# Patient Record
Sex: Female | Born: 1980 | Race: White | Hispanic: No | Marital: Married | Smoking: Never smoker
Health system: Southern US, Community
[De-identification: ages and names within clinical notes are randomized; demographics above are authoritative.]

## PROBLEM LIST (undated history)

## (undated) ENCOUNTER — Encounter (HOSPITAL_COMMUNITY): Payer: Self-pay

## (undated) ENCOUNTER — Ambulatory Visit (HOSPITAL_COMMUNITY): Payer: Self-pay | Admitting: Otology & Neurotology

## (undated) DIAGNOSIS — G9601 Cranial cerebrospinal fluid leak, spontaneous: Secondary | ICD-10-CM

## (undated) DIAGNOSIS — G9600 Cerebrospinal fluid leak, unspecified: Secondary | ICD-10-CM

## (undated) DIAGNOSIS — Z789 Other specified health status: Secondary | ICD-10-CM

## (undated) DIAGNOSIS — T4145XA Adverse effect of unspecified anesthetic, initial encounter: Secondary | ICD-10-CM

## (undated) DIAGNOSIS — T8859XA Other complications of anesthesia, initial encounter: Secondary | ICD-10-CM

## (undated) DIAGNOSIS — D333 Benign neoplasm of cranial nerves: Secondary | ICD-10-CM

## (undated) DIAGNOSIS — E039 Hypothyroidism, unspecified: Secondary | ICD-10-CM

## (undated) HISTORY — DX: Cranial cerebrospinal fluid leak, spontaneous: G96.01

## (undated) HISTORY — DX: Cerebrospinal fluid leak: G96.0

## (undated) HISTORY — PX: LAPAROSCOPY: SHX197

## (undated) HISTORY — PX: CRANIOTOMY: SHX93

## (undated) HISTORY — PX: BRAIN SURGERY: SHX531

## (undated) SURGERY — TYMPANOPLASTY WITH MASTOIDECTOMY
Anesthesia: General | Site: Ear | Laterality: Left

## (undated) MED ORDER — LIDOCAINE HCL (PF) 1 % IJ SOLN
0.1000 mL | INTRAMUSCULAR | Status: AC | PRN
Start: 2018-02-28 — End: ?

## (undated) MED ORDER — ACETAMINOPHEN 325 MG PO TABS
975.00 mg | ORAL_TABLET | Freq: Once | ORAL | Status: AC
Start: 2018-02-28 — End: 2018-02-28

## (undated) MED ORDER — LACTATED RINGERS IV SOLN
INTRAVENOUS | Status: AC
Start: 2018-02-28 — End: ?

---

## 2007-10-20 HISTORY — PX: OOPHORECTOMY: SHX86

## 2010-08-19 HISTORY — PX: LAPAROSCOPY: SHX197

## 2011-05-09 ENCOUNTER — Encounter (HOSPITAL_COMMUNITY): Payer: Self-pay | Admitting: *Deleted

## 2011-05-09 ENCOUNTER — Inpatient Hospital Stay (HOSPITAL_COMMUNITY): Payer: BC Managed Care – PPO

## 2011-05-09 ENCOUNTER — Inpatient Hospital Stay (HOSPITAL_COMMUNITY)
Admission: AD | Admit: 2011-05-09 | Discharge: 2011-05-10 | DRG: 380 | Disposition: A | Discharge status: Home / Self Care | Payer: BC Managed Care – PPO | Source: Ambulatory Visit | Attending: Obstetrics & Gynecology | Admitting: Obstetrics & Gynecology

## 2011-05-09 DIAGNOSIS — O021 Missed abortion: Principal | ICD-10-CM | POA: Diagnosis present

## 2011-05-09 DIAGNOSIS — Z98891 History of uterine scar from previous surgery: Secondary | ICD-10-CM

## 2011-05-09 DIAGNOSIS — O364XX0 Maternal care for intrauterine death, not applicable or unspecified: Secondary | ICD-10-CM

## 2011-05-09 DIAGNOSIS — O039 Complete or unspecified spontaneous abortion without complication: Secondary | ICD-10-CM | POA: Diagnosis present

## 2011-05-09 HISTORY — DX: Other complications of anesthesia, initial encounter: T88.59XA

## 2011-05-09 HISTORY — DX: Other specified health status: Z78.9

## 2011-05-09 HISTORY — DX: Adverse effect of unspecified anesthetic, initial encounter: T41.45XA

## 2011-05-09 NOTE — Progress Notes (Signed)
yesterday hit stomach on door knob right lower abdomen no bruising noted.  Then today went to the bathroom and noticed a 3 in circle of dark brown blood in under wear.  No other bleeding noted since then

## 2011-05-10 ENCOUNTER — Other Ambulatory Visit: Payer: Self-pay | Admitting: Obstetrics & Gynecology

## 2011-05-10 ENCOUNTER — Encounter (HOSPITAL_COMMUNITY): Payer: Self-pay | Admitting: *Deleted

## 2011-05-10 DIAGNOSIS — O039 Complete or unspecified spontaneous abortion without complication: Secondary | ICD-10-CM | POA: Diagnosis present

## 2011-05-10 DIAGNOSIS — O021 Missed abortion: Secondary | ICD-10-CM

## 2011-05-10 LAB — CBC
HCT: 35 % — ABNORMAL LOW (ref 36.0–46.0)
Hemoglobin: 11.9 g/dL — ABNORMAL LOW (ref 12.0–15.0)
MCH: 28.7 pg (ref 26.0–34.0)
MCHC: 34 g/dL (ref 30.0–36.0)
MCV: 84.5 fL (ref 78.0–100.0)

## 2011-05-10 LAB — TYPE AND SCREEN
ABO/RH(D): A POS
Antibody Screen: NEGATIVE

## 2011-05-10 LAB — MRSA PCR SCREENING: MRSA by PCR: INVALID — AB

## 2011-05-10 MED ORDER — LACTATED RINGERS IV SOLN
INTRAVENOUS | Status: DC | PRN
  Administered 2011-05-10 (×2): via INTRAVENOUS

## 2011-05-10 MED ORDER — SODIUM CHLORIDE 0.9 % IJ SOLN
3.0000 mL | Freq: Two times a day (BID) | INTRAMUSCULAR | Status: DC
  Administered 2011-05-10: 3 mL via INTRAVENOUS

## 2011-05-10 MED ORDER — ZOLPIDEM TARTRATE 10 MG PO TABS
10.0000 mg | ORAL_TABLET | Freq: Every evening | ORAL | Status: DC | PRN
  Administered 2011-05-10: 10 mg via ORAL
  Filled 2011-05-10: qty 1

## 2011-05-10 MED ORDER — HYDROXYZINE HCL 50 MG/ML IM SOLN
50.0000 mg | Freq: Four times a day (QID) | INTRAMUSCULAR | Status: DC | PRN
  Filled 2011-05-10: qty 1

## 2011-05-10 MED ORDER — ZOLPIDEM TARTRATE ER 6.25 MG PO TBCR: 6.2500 mg | EXTENDED_RELEASE_TABLET | Freq: Every evening | ORAL | Status: AC | PRN

## 2011-05-10 MED ORDER — OXYTOCIN 20 UNITS IN LACTATED RINGERS INFUSION - SIMPLE
250.0000 mL/h | INTRAVENOUS | Status: DC
  Filled 2011-05-10: qty 1000

## 2011-05-10 MED ORDER — MISOPROSTOL 200 MCG PO TABS
600.0000 ug | ORAL_TABLET | Freq: Four times a day (QID) | ORAL | Status: DC
  Filled 2011-05-10: qty 3

## 2011-05-10 MED ORDER — SODIUM CHLORIDE 0.9 % IV SOLN: 250.0000 mL | INTRAVENOUS | Status: DC

## 2011-05-10 MED ORDER — MISOPROSTOL 200 MCG PO TABS
600.0000 ug | ORAL_TABLET | Freq: Four times a day (QID) | ORAL | Status: DC
  Administered 2011-05-10: 600 ug via ORAL
  Filled 2011-05-10 (×2): qty 3

## 2011-05-10 MED ORDER — NALBUPHINE SYRINGE 5 MG/0.5 ML
5.0000 mg | INJECTION | INTRAMUSCULAR | Status: DC | PRN
  Administered 2011-05-10: 5 mg via INTRAVENOUS
  Filled 2011-05-10: qty 1

## 2011-05-10 MED ORDER — NALBUPHINE HCL 10 MG/ML IJ SOLN
5.0000 mg | INTRAMUSCULAR | Status: DC | PRN
  Administered 2011-05-10 (×2): 5 mg via INTRAVENOUS
  Filled 2011-05-10: qty 1

## 2011-05-10 MED ORDER — MISOPROSTOL 200 MCG PO TABS
600.0000 ug | ORAL_TABLET | Freq: Four times a day (QID) | ORAL | Status: DC
  Administered 2011-05-10: 600 ug via VAGINAL
  Filled 2011-05-10 (×3): qty 3

## 2011-05-10 MED ORDER — SODIUM CHLORIDE 0.9 % IJ SOLN: 3.0000 mL | INTRAMUSCULAR | Status: DC | PRN

## 2011-05-10 NOTE — Progress Notes (Signed)
  Patient progressed to expel the fetus in caul, intact BOW, of autolysed fetus 28 gm, then shortly thereafter expelled the placenta, apparently intact. Fetus inspected and shown to parents, no anatomic abnormalities noted.    Patient will be discharged later this pm.

## 2011-05-10 NOTE — Progress Notes (Signed)
Patient uncomfortable with contractions q 1-2 mins. Vag Exam, Bulging BOW noted in vag vault, no fetal parts thru cervix.  No urge to push/no rectal pressure.  Will all labor to continue at current process.  Delivery expected soon.

## 2011-05-10 NOTE — H&P (Signed)
Katelyn Willis is a 30 y.o. female at 16.6 weeks presenting for vaginal bleeding. She has been receiving PNC at [facility] in [place], but noticed brown bleeding while out to dinner in [place]. Maternal Medical History:  Reason for admission: Reason for admission: vaginal bleeding.  Contractions: Onset was 13-24 hours ago.   Frequency: irregular.   Perceived severity is mild.    Prenatal complications: Bleeding.     OB History    Grav Para Term Preterm Abortions TAB SAB Ect Mult Living   3 2 2  0 0 0 0 0 0 2     Past Medical History  Diagnosis Date  . No pertinent past medical history   . Complication of anesthesia     unpleasant waking up, thrashing and crying   Past Surgical History  Procedure Date  . Cesarean section 2007, 2005    x 2 attempt  . Laparoscopy Nov 2011    x2   Family History: family history is not on file. Social History:  reports that she has never smoked. She has never used smokeless tobacco. She reports that she does not drink alcohol or use illicit drugs.  Review of Systems  Genitourinary:       Multiple episode of spotting throughout pregnancy. New episode of light brown bleeding today.   All other systems reviewed and are negative.      Blood pressure 102/60, pulse 83, temperature 98.4 F (36.9 C), temperature source Oral, resp. rate 18, height 5\' 6"  (1.676 m), weight 67.767 kg (149 lb 6.4 oz). Maternal Exam:  Abdomen: Surgical scars: low transverse.   Fundal height is S=D.   Fetal presentation: vertex  Introitus: Normal vulva. Vaginal discharge: brown, creamy, discharge w/ normal odor.  Ferning test: negative.   Pelvis: adequate for delivery.   Cervix: Cervix evaluated by sterile speculum exam and digital exam.     Fetal Exam Fetal Monitor Review: Mode: ultrasound.   Baseline rate: No cardiac activity per [place].      Physical Exam  Constitutional: She appears well-developed and well-nourished.  HENT:  Head: Normocephalic.    Eyes: Pupils are equal, round, and reactive to light.  Cardiovascular: Normal rate and regular rhythm.   Murmur (systolic II/VI) heard. Respiratory: Effort normal and breath sounds normal.  GI: Soft. Bowel sounds are normal. There is no tenderness. There is no guarding.  Genitourinary: Uterus is not tender. Cervix exhibits no motion tenderness and no friability. Right adnexum displays no tenderness. Left adnexum displays no tenderness. There is bleeding (brownish-red, creamy, discharge w/ normal odor) around the vagina. Vaginal discharge: brown, creamy, discharge w/ normal odor.  Cervix long and closed   Prenatal labs: ABO, Rh:  A + (per pt) Antibody:   Rubella:   RPR:    HBsAg:    HIV:    GBS:     Results for orders placed during the hospital encounter of 05/09/11 (from the past 24 hour(s))  CBC     Status: Abnormal   Collection Time   05/10/11 12:35 AM      Component Value Range   WBC 6.8  4.0 - 10.5 (K/uL)   RBC 4.14  3.87 - 5.11 (MIL/uL)   Hemoglobin 11.9 (*) 12.0 - 15.0 (g/dL)   HCT 16.1 (*) 09.6 - 46.0 (%)   MCV 84.5  78.0 - 100.0 (fL)   MCH 28.7  26.0 - 34.0 (pg)   MCHC 34.0  30.0 - 36.0 (g/dL)   RDW 04.5  40.9 - 81.1 (%)  Platelets 203  150 - 400 (K/uL)  TYPE AND SCREEN     Status: Normal (Preliminary result)   Collection Time   05/10/11 12:35 AM      Component Value Range   ABO/RH(D) A POS     Antibody Screen PENDING     Sample Expiration 05/13/2011     ZO:XWRU, no cardiac activity, vertex, normal fluid, measuring 14.5 weeks, cervix closed  Assessment/Plan: Assessment: 1. Second trimester fetal demise 2. Hx LTCS x 2   3. Discussed expectant management, medical management and surgical management. Pt desires medical management and wants to stay at [facility] for Tx.  4.    Plan:  1. Admit to AICU per consult with Dr. Marice Potter for Cytotec induction. RN may place Cytotec 2. Analgesia, Ambien PRN 3. NSL 4. NPO 5. Support  given     Katelyn Willis 05/10/2011, 1:01 AM

## 2011-05-10 NOTE — Progress Notes (Signed)
Subjective: Patient reports that she would like genetic testing done on the fetus.  She would like this process finished ASAP.   Objective: I have reviewed patient's vital signs, intake and output, medications and labs.  Abd: benign   Assessment/Plan: Missed Ab at 14.5 weeks - continue with cytotec induction.  LOS: 1 day    Alexxus Sobh C. 05/10/2011, 6:44 AM

## 2011-05-10 NOTE — Discharge Summary (Signed)
  See dictation this date.  Transcription note number lost. CSN and MRN included in dictation at 5:30 pm

## 2011-05-10 NOTE — Plan of Care (Signed)
Problem: Consults Goal: Chaplain Consult Outcome: Not Applicable Date Met:  05/10/11 Pt prefers to see/talk w/her own clergy & will call herself

## 2011-05-11 NOTE — Progress Notes (Signed)
UR chart review completed.  

## 2011-05-11 NOTE — Discharge Summary (Signed)
[name], [name] NO.:  [id number]  MEDICAL RECORD NO.:  [id number]  LOCATION:  9374                          FACILITY:  [facility]  PHYSICIAN:  Tilda Burrow, M.D. DATE OF BIRTH:  May 25, 1981  DATE OF ADMISSION:  05/09/2011 DATE OF DISCHARGE:  05/10/2011                              DISCHARGE SUMMARY   ADMITTING DIAGNOSIS:  Missed abortion 14-[redacted] weeks gestation.  DISCHARGE DIAGNOSIS:  Missed abortion 14-[redacted] weeks gestation, delivered.  PROCEDURES:  Cytotec, induction of miscarriage.  DISCHARGE MEDICATIONS: 1. Ambien 6.25 mg #15, 1 p.o. q.6 h. p.r.n. insomnia. 2. Motrin 600 mg p.o. q.6 h. p.r.n. pain. 3. Prenatal vitamins 1 p.o. daily x30 days.  FOLLOWUP:  Two weeks [facility] [address] [place], [place], [address].  Fax number [phone], office number [phone].  HOSPITAL SUMMARY:  This 30 year old female gravida 3, para 2-0-1-2, 17 weeks 0 days' for gestational age who presented to [facility] with vaginal bleeding on the evening of May 09, 2011 with ultrasound confirming missed abortion.  The patient was admitted for Cytotec induction of evacuation of the uterus.  Admitting hemoglobin was 11.9, hematocrit 35, white count 6800, blood type A+, antibody screen was negative.  The patient had an ultrasound which confirmed a 14-week size fetus with absence of fetal heart motion and evidence of fetal body changes. Membranes were intact.  The patient was admitted to the hospital and placed on Cytotec 600 mcg per vagina q.6 h. and proceeded over the next 12 hours to spontaneous expulsion of the fetus with intact sac, followed shortly by expulsion of the placenta also and apparently intact.  Pain resolved, bleeding minimized and the patient showed interest in inspecting the fetus.  She has chosen to send the baby to a funeral home for cremation.  The tissue sample will be sent of the placenta and the fetus for genetic testing per patient  request.  She remained stable for discharge at 5:30 p.m. and was discharged home for followup in 2 weeks at [facility] with routine instructions given including watching for fever and notify a provider for any temperature greater than 100.6, any increasing bleeding greater than a period, or other patient concerns.     Tilda Burrow, M.D.     JVF/MEDQ  D:  05/10/2011  T:  06-07-11  Job:  [id number]  cc:   [facility]  Fax [phone]

## 2011-05-20 DEATH — deceased

## 2011-09-21 ENCOUNTER — Emergency Department (INDEPENDENT_AMBULATORY_CARE_PROVIDER_SITE_OTHER)
Admission: EM | Admit: 2011-09-21 | Discharge: 2011-09-21 | Disposition: A | Discharge status: Home / Self Care | Payer: BC Managed Care – PPO | Source: Home / Self Care | Attending: Emergency Medicine | Admitting: Emergency Medicine

## 2011-09-21 DIAGNOSIS — J0100 Acute maxillary sinusitis, unspecified: Secondary | ICD-10-CM

## 2011-09-21 HISTORY — DX: Hypothyroidism, unspecified: E03.9

## 2011-09-21 MED ORDER — PSEUDOEPHEDRINE-GUAIFENESIN ER 120-1200 MG PO TB12: 1.0000 | ORAL_TABLET | Freq: Two times a day (BID) | ORAL | Status: DC

## 2011-09-21 MED ORDER — AMOXICILLIN 875 MG PO TABS: 875.0000 mg | ORAL_TABLET | Freq: Two times a day (BID) | ORAL | Status: AC

## 2011-09-21 NOTE — ED Provider Notes (Signed)
History     CSN: [id number] Arrival date & time: 09/21/2011  6:21 PM   First MD Initiated Contact with Patient 09/21/11 1843      Chief Complaint  Patient presents with  . Cough  . Sore Throat  . Wheezing    (Consider location/radiation/quality/duration/timing/severity/associated sxs/prior treatment) Patient is a 30 y.o. female presenting with cough, pharyngitis, wheezing, and sinusitis.  Cough Associated symptoms include ear pain, sore throat and wheezing. Pertinent negatives include no chills, no sweats and no shortness of breath.  Sore Throat Pertinent negatives include no shortness of breath.  Wheezing  Associated symptoms include sore throat, cough and wheezing. Pertinent negatives include no shortness of breath.  Sinusitis  This is a new problem. The current episode started more than 1 week ago (7 or 8 days). The problem has been gradually worsening. The maximum temperature recorded prior to her arrival was 100 to 100.9 F. The fever has been present for 1 to 2 days. The pain is mild. The pain has been worsening since onset. Associated symptoms include congestion, ear pain, hoarse voice, sinus pressure, sore throat and cough. Pertinent negatives include no chills, no sweats and no shortness of breath. She has tried other medications for the symptoms. The treatment provided no relief.    Past Medical History  Diagnosis Date  . No pertinent past medical history   . Complication of anesthesia     unpleasant waking up, thrashing and crying  . Hypothyroidism     Past Surgical History  Procedure Date  . Cesarean section 2007, 2005    x 2 attempt  . Laparoscopy Nov 2011    x2  . Oophorectomy 2009  . Laparoscopy     No family history on file.  History  Substance Use Topics  . Smoking status: Never Smoker   . Smokeless tobacco: Never Used  . Alcohol Use: No    OB History    Grav Para Term Preterm Abortions TAB SAB Ect Mult Living   3 2 2  0 1 0 0 0 0 2       Review of Systems  Constitutional: Positive for activity change and fatigue. Negative for chills.  HENT: Positive for ear pain, congestion, sore throat, hoarse voice and sinus pressure. Negative for neck stiffness.   Eyes: Negative.   Respiratory: Positive for cough and wheezing. Negative for shortness of breath.   Cardiovascular: Negative.   Gastrointestinal: Negative.   Genitourinary: Negative.   Musculoskeletal: Negative.   Neurological: Negative.   Hematological: Negative.   Psychiatric/Behavioral: Negative.     Allergies  Review of patient's allergies indicates no known allergies.  Home Medications   Current Outpatient Rx  Name Route Sig Dispense Refill  . AMOXICILLIN 875 MG PO TABS Oral Take 1 tablet (875 mg total) by mouth 2 (two) times daily. Take for 10 days. 20 tablet 0  . PRENATAL PLUS 27-1 MG PO TABS Oral Take 1 tablet by mouth daily.      Marland Kitchen PSEUDOEPHEDRINE-GUAIFENESIN [phone] MG PO TB12 Oral Take 1 tablet by mouth 2 (two) times daily. As needed for congestion. (Caution: Do not take if blood pressure is elevated) 20 each 0    BP 106/75  Pulse 83  Temp(Src) 98.5 F (36.9 C) (Oral)  Resp 20  Ht 5\' 6"  (1.676 m)  Wt 130 lb 8 oz (59.194 kg)  BMI 21.06 kg/m2  SpO2 99%  LMP 09/09/2011  Physical Exam  Nursing note and vitals reviewed. Constitutional: She is oriented to person,  place, and time. She appears well-developed and well-nourished. No distress.  HENT:  Head: Normocephalic and atraumatic.  Right Ear: Tympanic membrane, external ear and ear canal normal.  Left Ear: Tympanic membrane, external ear and ear canal normal.  Nose: Mucosal edema and rhinorrhea present. Right sinus exhibits maxillary sinus tenderness. Left sinus exhibits maxillary sinus tenderness.  Mouth/Throat: Oropharynx is clear and moist. No oral lesions. No oropharyngeal exudate.  Eyes: Right eye exhibits no discharge. Left eye exhibits no discharge. No scleral icterus.  Neck: Neck  supple.  Cardiovascular: Normal rate, regular rhythm and normal heart sounds.   Pulmonary/Chest: Effort normal and breath sounds normal. She has no wheezes. She has no rales.  Lymphadenopathy:    She has no cervical adenopathy.  Neurological: She is alert and oriented to person, place, and time.  Skin: Skin is warm and dry.    ED Course  Procedures (including critical care time)  Labs Reviewed - No data to display No results found.   1. Sinusitis, acute maxillary       MDM  Amoxicillin and prescription strength Mucinex D prescribed. Precautions discussed. She denies chance of pregnancy, although she and husband are trying to conceive. She has calculated that she be related 3 days ago. We discussed risks, benefits, alternatives, and she is agreeable with this treatment plan. She voiced understanding and agreement. Other nonpharmacologic measures for sinusitis discussed.        Lonell Face, MD 09/21/11 [phone]

## 2012-10-13 ENCOUNTER — Emergency Department (INDEPENDENT_AMBULATORY_CARE_PROVIDER_SITE_OTHER)
Admission: EM | Admit: 2012-10-13 | Discharge: 2012-10-13 | Disposition: A | Discharge status: Home / Self Care | Payer: BC Managed Care – PPO | Source: Home / Self Care | Attending: Family Medicine | Admitting: Family Medicine

## 2012-10-13 ENCOUNTER — Encounter: Payer: Self-pay | Admitting: Emergency Medicine

## 2012-10-13 DIAGNOSIS — J029 Acute pharyngitis, unspecified: Secondary | ICD-10-CM

## 2012-10-13 DIAGNOSIS — R42 Dizziness and giddiness: Secondary | ICD-10-CM

## 2012-10-13 DIAGNOSIS — J069 Acute upper respiratory infection, unspecified: Secondary | ICD-10-CM

## 2012-10-13 LAB — CBC WITH DIFFERENTIAL/PLATELET
Basophils Absolute: 0 10*3/uL (ref 0.0–0.1)
Basophils Relative: 1 % (ref 0–1)
Hemoglobin: 13.9 g/dL (ref 12.0–15.0)
MCHC: 33.9 g/dL (ref 30.0–36.0)
Monocytes Relative: 8 % (ref 3–12)
Neutro Abs: 3.1 10*3/uL (ref 1.7–7.7)
Neutrophils Relative %: 56 % (ref 43–77)
RDW: 12.7 % (ref 11.5–15.5)

## 2012-10-13 LAB — POCT INFLUENZA A/B
Influenza A, POC: NEGATIVE
Influenza B, POC: NEGATIVE

## 2012-10-13 LAB — POCT RAPID STREP A (OFFICE): Rapid Strep A Screen: NEGATIVE

## 2012-10-13 MED ORDER — MECLIZINE HCL 25 MG PO TABS: 25.0000 mg | ORAL_TABLET | Freq: Three times a day (TID) | ORAL | Status: DC | PRN

## 2012-10-13 NOTE — ED Provider Notes (Signed)
History     CSN: [id number]  Arrival date & time 10/13/12  1256   None     Chief Complaint  Patient presents with  . Sore Throat   HPI  URI Symptoms Onset: 1 week Description: rhinorrhea, nasal congestion, neck pain, headache Modifying factors:  + sick contacts with similar sxs at home. Husband and child diagnosed with strep throat recently.   Symptoms Nasal discharge: mild Fever: no Sore throat: mild Cough: mild Wheezing: no Ear pain: no GI symptoms: no Sick contacts: yes  Red Flags  Stiff neck: neck discomfort  Dyspnea: no Rash: no Swallowing difficulty: no  Sinusitis Risk Factors Headache/face pain: mild Double sickening: no tooth pain: no  Allergy Risk Factors Sneezing: no Itchy scratchy throat: no Seasonal symptoms: no  Flu Risk Factors Headache: mild muscle aches: no severe fatigue: no   Past Medical History  Diagnosis Date  . No pertinent past medical history   . Complication of anesthesia     unpleasant waking up, thrashing and crying  . Hypothyroidism     Past Surgical History  Procedure Date  . Cesarean section 2007, 2005    x 2 attempt  . Laparoscopy Nov 2011    x2  . Oophorectomy 2009  . Laparoscopy     No family history on file.  History  Substance Use Topics  . Smoking status: Never Smoker   . Smokeless tobacco: Never Used  . Alcohol Use: No    OB History    Grav Para Term Preterm Abortions TAB SAB Ect Mult Living   3 2 2  0 1 0 0 0 0 2      Review of Systems  All other systems reviewed and are negative.    Allergies  Review of patient's allergies indicates not on file.  Home Medications   Current Outpatient Rx  Name  Route  Sig  Dispense  Refill  . MECLIZINE HCL 25 MG PO TABS   Oral   Take 1 tablet (25 mg total) by mouth 3 (three) times daily as needed.   30 tablet   0   . PRENATAL PLUS 27-1 MG PO TABS   Oral   Take 1 tablet by mouth daily.           Marland Kitchen PSEUDOEPHEDRINE-GUAIFENESIN ER [phone] MG  PO TB12   Oral   Take 1 tablet by mouth 2 (two) times daily. As needed for congestion. (Caution: Do not take if blood pressure is elevated)   20 each   0     BP 104/71  Pulse 86  Temp 97.8 F (36.6 C) (Oral)  Resp 16  Ht 5\' 6"  (1.676 m)  Wt 145 lb (65.772 kg)  BMI 23.40 kg/m2  SpO2 98%  Physical Exam  Constitutional: She appears well-developed and well-nourished.  HENT:  Head: Normocephalic and atraumatic.  Right Ear: External ear normal.  Left Ear: External ear normal.       +nasal erythema, rhinorrhea bilaterally, + post oropharyngeal erythema    Eyes: Conjunctivae normal are normal. Pupils are equal, round, and reactive to light.  Neck: Normal range of motion. Neck supple.  Cardiovascular: Normal rate, regular rhythm and normal heart sounds.   Pulmonary/Chest: Effort normal and breath sounds normal.  Abdominal: Soft.  Neurological: She is alert.       + horizontal nystagmus  dix hallpike positive    Skin: Skin is warm.    ED Course  Procedures (including critical care time)   Labs Reviewed  POCT RAPID STREP A (OFFICE)  POCT INFLUENZA A/B  POCT MONO SCREEN ([facility])  STREP A DNA PROBE  CBC WITH DIFFERENTIAL   No results found.   1. URI (upper respiratory infection)   2. Vertigo       MDM  Viral URI with secondary vertigo.  Will perform strep culture.  Will treat clinically with meclizine.  Discussed neuro and infectious red flags at length.  Follow up as needed.     The patient and/or caregiver has been counseled thoroughly with regard to treatment plan and/or medications prescribed including dosage, schedule, interactions, rationale for use, and possible side effects and they verbalize understanding. Diagnoses and expected course of recovery discussed and will return if not improved as expected or if the condition worsens. Patient and/or caregiver verbalized understanding.             Doree Albee, MD 11/01/12 1929

## 2012-10-13 NOTE — ED Notes (Signed)
Sore throat, husband and child have been positive for strep, neck hurts, headache x 1 week

## 2012-10-14 LAB — STREP A DNA PROBE: GASP: NEGATIVE

## 2012-10-26 ENCOUNTER — Telehealth: Payer: Self-pay | Admitting: Emergency Medicine

## 2012-11-10 ENCOUNTER — Emergency Department (INDEPENDENT_AMBULATORY_CARE_PROVIDER_SITE_OTHER)
Admission: EM | Admit: 2012-11-10 | Discharge: 2012-11-10 | Disposition: A | Discharge status: Home / Self Care | Payer: BC Managed Care – PPO | Source: Home / Self Care | Attending: Family Medicine | Admitting: Family Medicine

## 2012-11-10 ENCOUNTER — Encounter: Payer: Self-pay | Admitting: *Deleted

## 2012-11-10 DIAGNOSIS — A499 Bacterial infection, unspecified: Secondary | ICD-10-CM

## 2012-11-10 DIAGNOSIS — B9689 Other specified bacterial agents as the cause of diseases classified elsewhere: Secondary | ICD-10-CM

## 2012-11-10 DIAGNOSIS — N760 Acute vaginitis: Secondary | ICD-10-CM

## 2012-11-10 DIAGNOSIS — R42 Dizziness and giddiness: Secondary | ICD-10-CM

## 2012-11-10 MED ORDER — PREDNISONE 20 MG PO TABS: 20.0000 mg | ORAL_TABLET | Freq: Two times a day (BID) | ORAL | Status: DC

## 2012-11-10 MED ORDER — METRONIDAZOLE 500 MG PO TABS
500.0000 mg | ORAL_TABLET | Freq: Two times a day (BID) | ORAL | Status: DC
Start: 2012-11-10 — End: 2014-07-21

## 2012-11-10 MED ORDER — AMOXICILLIN 875 MG PO TABS: 875.0000 mg | ORAL_TABLET | Freq: Two times a day (BID) | ORAL | Status: DC

## 2012-11-10 NOTE — ED Provider Notes (Signed)
History     CSN: [id number]  Arrival date & time 11/10/12  1225   First MD Initiated Contact with Patient 11/10/12 1252      Chief Complaint  Patient presents with  . Dizziness  . Nasal Congestion  . Vaginal Discharge     HPI Comments: Patient was diagnosed with vertigo on 10/13/12, but the symptoms have persisted intermittently without improvement from Antivert.  She has had persistent sinus congestion, worse on the left, and a sensation of fullness in the left ear.  No fevers, chills, and sweats. She also states that she was treated by her GYN two weeks ago for bacterial vaginosis with Tinidazole.  The condition resolved but recurred and she believes that her husband has caused reinfection.  She states that she has had a similar problem in the past, and had complete resolution when both she and her husband were treated with Flagyl.  She denies pelvic pain or fever.  The history is provided by the patient.    Past Medical History  Diagnosis Date  . No pertinent past medical history   . Complication of anesthesia     unpleasant waking up, thrashing and crying  . Hypothyroidism     Past Surgical History  Procedure Date  . Cesarean section 2007, 2005    x 2 attempt  . Laparoscopy Nov 2011    x2  . Oophorectomy 2009  . Laparoscopy     History reviewed. No pertinent family history.  History  Substance Use Topics  . Smoking status: Never Smoker   . Smokeless tobacco: Never Used  . Alcohol Use: No    OB History    Grav Para Term Preterm Abortions TAB SAB Ect Mult Living   3 2 2  0 1 0 0 0 0 2      Review of Systems No sore throat No cough No pleuritic pain No wheezing + nasal congestion + post-nasal drainage + sinus pain/pressure No itchy/red eyes ? left earache No hemoptysis No SOB No fever/chills No nausea No vomiting No abdominal pain, but has had pelvic cramps and vaginal discharge, recurrent No diarrhea No urinary symptoms No skin rashes No  fatigue No myalgias + headache    Allergies  Gluten meal  Home Medications   Current Outpatient Rx  Name  Route  Sig  Dispense  Refill  . AMOXICILLIN 875 MG PO TABS   Oral   Take 1 tablet (875 mg total) by mouth 2 (two) times daily.   14 tablet   0   . MECLIZINE HCL 25 MG PO TABS   Oral   Take 1 tablet (25 mg total) by mouth 3 (three) times daily as needed.   30 tablet   0   . METRONIDAZOLE 500 MG PO TABS   Oral   Take 1 tablet (500 mg total) by mouth 2 (two) times daily.   14 tablet   0   . PREDNISONE 20 MG PO TABS   Oral   Take 1 tablet (20 mg total) by mouth 2 (two) times daily.   10 tablet   0   . PRENATAL PLUS 27-1 MG PO TABS   Oral   Take 1 tablet by mouth daily.           Marland Kitchen PSEUDOEPHEDRINE-GUAIFENESIN ER [phone] MG PO TB12   Oral   Take 1 tablet by mouth 2 (two) times daily. As needed for congestion. (Caution: Do not take if blood pressure is elevated)   20  each   0     BP 103/73  Pulse 85  Temp 97.7 F (36.5 C) (Oral)  Resp 14  Ht 5\' 6"  (1.676 m)  Wt 149 lb (67.586 kg)  BMI 24.05 kg/m2  SpO2 93%  Physical Exam Nursing notes and Vital Signs reviewed. Appearance:  Patient appears healthy, stated age, and in no acute distress Eyes:  Pupils are equal, round, and reactive to light and accomodation.  Extraocular movement is intact.  Conjunctivae are not inflamed  Ears:  Canals normal.  Tympanic membranes normal although left tympanic membrane slightly retracted Nose:  Mildly congested turbinates.  No sinus tenderness.    Pharynx:  Normal Neck:  Supple.  No adenopathy Lungs:  Clear to auscultation.  Breath sounds are equal.  Heart:  Regular rate and rhythm without murmurs, rubs, or gallops.  Abdomen:  Nontender without masses or hepatosplenomegaly.  Bowel sounds are present.  No CVA or flank tenderness.  Extremities:  No edema.  No calf tenderness Skin:  No rash present. Pelvic exam:  Deferred   ED Course  Procedures  none  Labs Reviewed -   Tympanogram normal both ears    1. Vertigo   2. Bacterial vaginosis by history, recurrent      MDM  Begin prednisone burst, and empiric amoxicillin for one week. Begin empiric Flagyl for one week; Rx written for spouse also. Followup with ENT if not improved in one week.         Lattie Haw, MD 11/11/12 [phone]

## 2012-11-10 NOTE — ED Notes (Signed)
Patient was seen here in late December for HA and dizziness. She still has the HA and dizziness along with congestion. She also reports pelvic cramping with vaginal discharge and odor. She has tried treating it with probiotics without relief. She is currently breastfeeding.

## 2014-06-20 ENCOUNTER — Emergency Department (INDEPENDENT_AMBULATORY_CARE_PROVIDER_SITE_OTHER)
Admission: EM | Admit: 2014-06-20 | Discharge: 2014-06-20 | Disposition: A | Discharge status: Home / Self Care | Payer: BC Managed Care – PPO | Source: Home / Self Care | Attending: Family Medicine | Admitting: Family Medicine

## 2014-06-20 ENCOUNTER — Emergency Department (INDEPENDENT_AMBULATORY_CARE_PROVIDER_SITE_OTHER): Payer: BC Managed Care – PPO

## 2014-06-20 ENCOUNTER — Encounter: Payer: Self-pay | Admitting: Emergency Medicine

## 2014-06-20 DIAGNOSIS — R519 Headache, unspecified: Secondary | ICD-10-CM

## 2014-06-20 DIAGNOSIS — R51 Headache: Secondary | ICD-10-CM

## 2014-06-20 DIAGNOSIS — M5380 Other specified dorsopathies, site unspecified: Secondary | ICD-10-CM

## 2014-06-20 NOTE — Discharge Instructions (Signed)
Apply ice pack to back of neck for 15 to 20 minutes, 3 to 4 times daily  Continue until pain decreases.  May take Ibuprofen 200mg , 4 tabs every 8 hours with food.

## 2014-06-20 NOTE — ED Provider Notes (Signed)
CSN: [id number]     Arrival date & time 06/20/14  1255 History   First MD Initiated Contact with Patient 06/20/14 1312     Chief Complaint  Patient presents with  . Headache      HPI Comments: Patient complains of two week history of dull posterior headache and neck soreness radiating to her left ear.  Her occipital headache has been worse over the past five days.  No URI symptoms or nasal congestion.  No fevers, chills, and sweats. She has a history of left acoustic neuroma, and is waiting for MRI to be scheduled by her [facility] neurosurgeon Dr. Salem Senate.  Patient is a 33 y.o. female presenting with headaches. The history is provided by the patient.  Headache Pain location:  Occipital and R temporal Quality:  Dull Radiates to: left ear. Severity at highest:  5/10 Onset quality:  Gradual Duration:  2 weeks Timing:  Constant Progression:  Unchanged Chronicity:  New Relieved by: sleeping. Worsened by:  Neck movement Ineffective treatments:  None tried Associated symptoms: congestion and neck stiffness   Associated symptoms: no blurred vision, no dizziness, no drainage, no ear pain, no pain, no facial pain, no fatigue, no fever, no focal weakness, no hearing loss, no loss of balance, no myalgias, no nausea, no neck pain, no numbness, no paresthesias, no photophobia, no sinus pressure, no sore throat, no swollen glands and no URI     Past Medical History  Diagnosis Date  . No pertinent past medical history   . Complication of anesthesia     unpleasant waking up, thrashing and crying  . Hypothyroidism    Past Surgical History  Procedure Laterality Date  . Cesarean section  2007, 2005    x 2 attempt  . Laparoscopy  Nov 2011    x2  . Oophorectomy  2009  . Laparoscopy    . Brain surgery      Benign Neuroma 05/18/2013   History reviewed. No pertinent family history. History  Substance Use Topics  . Smoking status: Never Smoker   . Smokeless tobacco: Never Used  . Alcohol  Use: No   OB History   Grav Para Term Preterm Abortions TAB SAB Ect Mult Living   3 2 2  0 1 0 0 0 0 2     Review of Systems  Constitutional: Negative for fever and fatigue.  HENT: Positive for congestion. Negative for ear pain, hearing loss, postnasal drip, sinus pressure and sore throat.   Eyes: Negative for blurred vision, photophobia and pain.  Gastrointestinal: Negative for nausea.  Musculoskeletal: Positive for neck stiffness. Negative for myalgias and neck pain.  Neurological: Positive for headaches. Negative for dizziness, focal weakness, numbness, paresthesias and loss of balance.    Allergies  Gluten meal  Home Medications   Prior to Admission medications   Medication Sig Start Date End Date Taking? Authorizing Provider  amoxicillin (AMOXIL) 875 MG tablet Take 1 tablet (875 mg total) by mouth 2 (two) times daily. 11/10/12   Kandra Nicolas, MD  meclizine (ANTIVERT) 25 MG tablet Take 1 tablet (25 mg total) by mouth 3 (three) times daily as needed. 10/13/12   Shanda Howells, MD  metroNIDAZOLE (FLAGYL) 500 MG tablet Take 1 tablet (500 mg total) by mouth 2 (two) times daily. 11/10/12   Kandra Nicolas, MD  predniSONE (DELTASONE) 20 MG tablet Take 1 tablet (20 mg total) by mouth 2 (two) times daily. 11/10/12   Kandra Nicolas, MD  prenatal vitamin w/FE,  FA (PRENATAL 1 + 1) 27-1 MG TABS Take 1 tablet by mouth daily.      Historical Provider, MD  Pseudoephedrine-Guaifenesin [phone] MG TB12 Take 1 tablet by mouth 2 (two) times daily. As needed for congestion. (Caution: Do not take if blood pressure is elevated) 09/21/11   Jacqulyn Cane, MD   BP 106/68  Pulse 77  Temp(Src) 98.8 F (37.1 C) (Oral)  Resp 16  Ht 5\' 6"  (1.676 m)  Wt 119 lb (53.978 kg)  BMI 19.22 kg/m2  SpO2 97%  LMP 05/24/2014 Physical Exam  Nursing note and vitals reviewed. Constitutional: She is oriented to person, place, and time. She appears well-developed and well-nourished. No distress.  HENT:  Head:  Normocephalic.  Right Ear: External ear normal.  Left Ear: External ear normal.  Mouth/Throat: Oropharynx is clear and moist.  Eyes: Conjunctivae and EOM are normal. Pupils are equal, round, and reactive to light. Right eye exhibits no discharge. Left eye exhibits no discharge.  Neck: Neck supple.  Mild tenderness left trapezius area  Cardiovascular: Normal heart sounds.   Pulmonary/Chest: Breath sounds normal.  Abdominal: There is no tenderness.  Lymphadenopathy:    She has no cervical adenopathy.  Neurological: She is alert and oriented to person, place, and time. She displays normal reflexes. No cranial nerve deficit. Coordination normal.  Skin: Skin is warm and dry. No rash noted.    ED Course  Procedures  none    Imaging Review Dg Cervical Spine Complete  06/20/2014   CLINICAL DATA:  Neck pain for 1 week.  EXAM: CERVICAL SPINE  4+ VIEWS  COMPARISON:  None.  FINDINGS: The height and alignment are normal. Intervertebral disc space height is maintained. Neural foramina appear widely patent. There is facet degenerative disease at C7-T1. Lung apices are clear.  IMPRESSION: No acute abnormality.  Facet degenerative disease C7-T1.   Electronically Signed   By: Inge Rise M.D.   On: 06/20/2014 13:50     MDM   1. Occipital headache; ?C2 radiculopathy     Apply ice pack to back of neck for 15 to 20 minutes, 3 to 4 times daily  Continue until pain decreases.  May take Ibuprofen 200mg , 4 tabs every 8 hours with food.  Followup with neurologist as soon as possible to schedule MRI    Kandra Nicolas, MD 06/24/14 [phone]

## 2014-06-20 NOTE — ED Notes (Signed)
Patient transported to X-ray 

## 2014-06-20 NOTE — ED Notes (Signed)
Gradual onset of worsening headache x 2 weeks.  Pain to top and back of head.  Denies any fever.  Patient states that she has contacted Dr. Salem Senate, Neurosurgeon at [facility] and is awaiting arrangement for an MRI to be scheduled next week.  Patient states that she wants to be checked out for sinus/ear infections.

## 2014-06-20 NOTE — ED Notes (Signed)
Patient returns to exam room.

## 2014-07-21 ENCOUNTER — Emergency Department (INDEPENDENT_AMBULATORY_CARE_PROVIDER_SITE_OTHER)
Admission: EM | Admit: 2014-07-21 | Discharge: 2014-07-21 | Disposition: A | Discharge status: Home / Self Care | Payer: BC Managed Care – PPO | Source: Home / Self Care | Attending: Emergency Medicine | Admitting: Emergency Medicine

## 2014-07-21 ENCOUNTER — Encounter: Payer: Self-pay | Admitting: Emergency Medicine

## 2014-07-21 DIAGNOSIS — L03031 Cellulitis of right toe: Secondary | ICD-10-CM

## 2014-07-21 HISTORY — DX: Benign neoplasm of cranial nerves: D33.3

## 2014-07-21 MED ORDER — SULFAMETHOXAZOLE-TRIMETHOPRIM 800-160 MG PO TABS: 1.0000 | ORAL_TABLET | Freq: Two times a day (BID) | ORAL | Status: DC

## 2014-07-21 MED ORDER — FLUCONAZOLE 150 MG PO TABS: 150.0000 mg | ORAL_TABLET | Freq: Once | ORAL | Status: DC

## 2014-07-21 NOTE — ED Notes (Signed)
Pulled door over R great toe injuring toe at base of nail.  Has been tx with OTC products without success.

## 2014-07-21 NOTE — ED Provider Notes (Signed)
CSN: [id number]     Arrival date & time 07/21/14  1149 History   First MD Initiated Contact with Patient 07/21/14 1222     Chief Complaint  Patient presents with  . Toe Injury    The history is provided by the patient.   about 2-1/2 weeks ago, while opening a door towards her,she accidentally jammed the bottom of the door up against dorsum of right great toe. Immediately had some pain and swelling but she's been soaking it and initially improved somewhat. She's been very active, going to exercise class and caring for her young children. However in the past week, has worsening pain and swelling just proximal to the right great toe cuticle. Pain is 4/5, worse with touching . About a week ago had some scant clearish-yellow drainage that's resolved.  Denies fever or chills or nausea or vomiting. Denies systemic symptoms. She states her temperature is normally 97.2, so she feels 98.2 was a low-grade fever for her. Denies lightheadedness or syncope. She denies chance of pregnancy. LNMP Started 2 days ago She breast-feeds her-34-year-old  Past Medical History  Diagnosis Date  . No pertinent past medical history   . Complication of anesthesia     unpleasant waking up, thrashing and crying  . Hypothyroidism   . Acoustic neuroma    Past Surgical History  Procedure Laterality Date  . Cesarean section  2007, 2005    x 2 attempt  . Laparoscopy  Nov 2011    x2  . Oophorectomy  2009  . Laparoscopy    . Brain surgery      Benign Neuroma 05/18/2013  . Craniotomy      removal of most of acoustic neuroma   No family history on file. History  Substance Use Topics  . Smoking status: Never Smoker   . Smokeless tobacco: Never Used  . Alcohol Use: No   OB History   Grav Para Term Preterm Abortions TAB SAB Ect Mult Living   '3 2 2 '$ 0 1 0 0 0 0 2     Review of Systems  All other systems reviewed and are negative.   Allergies  Gluten meal  Home Medications   Prior to Admission medications    Medication Sig Start Date End Date Taking? Authorizing Provider  fluconazole (DIFLUCAN) 150 MG tablet Take 1 tablet (150 mg total) by mouth once. Take 1 now, then may repeat x1 in 4 days, for yeast infection. 07/21/14   Jacqulyn Cane, MD  sulfamethoxazole-trimethoprim (SEPTRA DS) 800-160 MG per tablet Take 1 tablet by mouth 2 (two) times daily. X 10 days 07/21/14   Jacqulyn Cane, MD   BP 99/66  Pulse 77  Temp(Src) 98.2 F (36.8 C) (Oral)  Ht $R'5\' 6"'RW$  (1.676 m)  Wt 116 lb (52.617 kg)  BMI 18.73 kg/m2  LMP 07/19/2014 Physical Exam  Nursing note and vitals reviewed. Constitutional: She is oriented to person, place, and time. She appears well-developed and well-nourished. No distress.  HENT:  Head: Normocephalic and atraumatic.  Eyes: Conjunctivae and EOM are normal. Pupils are equal, round, and reactive to light. No scleral icterus.  Neck: Normal range of motion.  Cardiovascular: Normal rate.   Pulmonary/Chest: Effort normal.  Abdominal: She exhibits no distension.  Musculoskeletal: Normal range of motion.       Right foot: She exhibits normal range of motion and normal capillary refill.       Feet:  Red, indurated, swollen, tender area just proximal to right great toenail cuticle. The  center part of the toenail base at the cuticle, is partially raised. No fluctuance or drainage or bleeding No subungual hematoma. Neurovascular distally intact. There is no open wound.  Neurological: She is alert and oriented to person, place, and time.  Skin: Skin is warm.  Psychiatric: She has a normal mood and affect.    ED Course  Procedures (including critical care time) Labs Review Labs Reviewed - No data to display  Imaging Review No results found.   MDM   1. Cellulitis of great toe, right    no fluctuance. No evidence of abscess. Neurovascular intact  Treatment options discussed, as well as risks, benefits, alternatives. We are avoiding doxycycline because she is breast-feeding  her-54-year-old. She declined x-ray of right great toe. Patient voiced understanding and agreement with the following plans:  Over 25 minutes spent, greater than 50% of the time spent for counseling and coordination of care. Septra DS twice a day po x10 days Diflucan rx in case she gets yeast infection Bacitracin ointment and Band-Aid applied today.--Daily wound care discussed.  She declined any procedures today, although I offered doing digital block and debrideing the small flap of the avulsed toenail. She voiced understanding, that in any case, because of the original injury 2-1/2 weeks ago, there is a risk of the toenail not growing back, either partially or completely, with risk that it grows back with some deformity.--She voiced understanding.  Follow-up with podiatrist or primary care doctor in 5-7 days if not improving, or sooner if symptoms become worse. Precautions discussed. Red flags discussed. Questions invited and answered. Patient voiced understanding and agreement.   Jacqulyn Cane, MD 07/21/14 1240

## 2014-08-20 ENCOUNTER — Encounter: Payer: Self-pay | Admitting: Emergency Medicine

## 2015-02-08 ENCOUNTER — Other Ambulatory Visit (HOSPITAL_COMMUNITY): Payer: Self-pay | Admitting: Family Medicine

## 2015-02-08 DIAGNOSIS — T148XXA Other injury of unspecified body region, initial encounter: Principal | ICD-10-CM

## 2015-02-08 DIAGNOSIS — X503XXA Overexertion from repetitive movements, initial encounter: Secondary | ICD-10-CM

## 2015-10-20 HISTORY — PX: OTHER PROCEDURE: U1053

## 2016-02-07 ENCOUNTER — Other Ambulatory Visit: Payer: Self-pay

## 2016-07-29 ENCOUNTER — Other Ambulatory Visit: Payer: Self-pay

## 2016-08-12 ENCOUNTER — Encounter: Payer: Self-pay | Admitting: *Deleted

## 2016-08-12 ENCOUNTER — Emergency Department (INDEPENDENT_AMBULATORY_CARE_PROVIDER_SITE_OTHER)
Admission: EM | Admit: 2016-08-12 | Discharge: 2016-08-12 | Disposition: A | Discharge status: Home / Self Care | Payer: BLUE CROSS/BLUE SHIELD | Source: Home / Self Care | Attending: Family Medicine | Admitting: Family Medicine

## 2016-08-12 DIAGNOSIS — H6692 Otitis media, unspecified, left ear: Secondary | ICD-10-CM

## 2016-08-12 HISTORY — DX: Cerebrospinal fluid leak, unspecified: G96.00

## 2016-08-12 HISTORY — DX: Cerebrospinal fluid leak: G96.0

## 2016-08-12 LAB — POCT CBC W AUTO DIFF (K'VILLE URGENT CARE)

## 2016-08-12 NOTE — ED Provider Notes (Signed)
CSN: [id number]     Arrival date & time 08/12/16  1841 History   First MD Initiated Contact with Patient 08/12/16 1909     Chief Complaint  Patient presents with  . Otalgia  . Cough   (Consider location/radiation/quality/duration/timing/severity/associated sxs/prior Treatment) HPI  Katelyn Willis is a 35 y.o. female presenting to UC with c/o 5 days of low grade fever- Tmax 99.6*F, and mild intermittent non-productive cough.  She developed Left ear pain earlier today, which is mild at this time.  Pt has hx of acoustic neuroma and CSF leak.  She called her neurologist office today to ask about her low-grade fever, they recommended she be evaluated and have blood work drawn to check her WBC.  She notes she found a prescription for Ofloxacin and also some Keflex at home she did not realized she was suppose to take at one time so she started to take today.  Keflex- 250mg  4x daily and ofloxacin 0.3% otic solution 5 drops daily.  Denies n/v/d. Denies headache, dizziness, or any new symptoms besides the ear pain, cough, and low-grade fever.    Past Medical History:  Diagnosis Date  . Acoustic neuroma ([place])   . Acoustic neuroma ([place])   . Complication of anesthesia    unpleasant waking up, thrashing and crying  . CSF leak   . Hypothyroidism   . No pertinent past medical history    Past Surgical History:  Procedure Laterality Date  . BRAIN SURGERY     Benign Neuroma 05/18/2013  . CESAREAN SECTION  2007, 2005   x 2 attempt  . CRANIOTOMY     removal of most of acoustic neuroma  . LAPAROSCOPY  Nov 2011   x2  . LAPAROSCOPY    . OOPHORECTOMY  2009   History reviewed. No pertinent family history. Social History  Substance Use Topics  . Smoking status: Never Smoker  . Smokeless tobacco: Never Used  . Alcohol use No   OB History    Gravida Para Term Preterm AB Living   3 2 2  0 1 2   SAB TAB Ectopic Multiple Live Births   0 0 0 0       Review of Systems  Constitutional: Positive for  chills and fever.  HENT: Positive for ear pain (Left). Negative for congestion, rhinorrhea, sneezing and sore throat.   Eyes: Negative for photophobia, pain and visual disturbance.  Respiratory: Positive for cough. Negative for shortness of breath and wheezing.   Gastrointestinal: Negative for diarrhea, nausea and vomiting.  Musculoskeletal: Negative for arthralgias, myalgias, neck pain and neck stiffness.  Neurological: Negative for dizziness, light-headedness and headaches.    Allergies  Gluten meal  Home Medications   Prior to Admission medications   Medication Sig Start Date End Date Taking? Authorizing Provider  acetaZOLAMIDE (DIAMOX) 250 MG tablet Take 250 mg by mouth 3 (three) times daily.   Yes Historical Provider, MD  cephALEXin (KEFLEX) 250 MG capsule Take by mouth 4 (four) times daily.   Yes Historical Provider, MD  naltrexone (DEPADE) 50 MG tablet Take by mouth daily.   Yes Historical Provider, MD  ofloxacin (FLOXIN) 0.3 % otic solution 5 drops daily.   Yes Historical Provider, MD   Meds Ordered and Administered this Visit  Medications - No data to display  BP 100/69 (BP Location: Left Arm)   Pulse 89   Temp 98.2 F (36.8 C) (Oral)   Resp 14   Ht 5\' 6"  (1.676 m)   Wt 120  lb (54.4 kg)   LMP 08/09/2016   SpO2 95%   BMI 19.37 kg/m  No data found.   Physical Exam  Constitutional: She is oriented to person, place, and time. She appears well-developed and well-nourished. No distress.  Pt sitting in exam chair, NAD. Non-toxic appearing.   HENT:  Head: Normocephalic and atraumatic.  Right Ear: No drainage or tenderness. Tympanic membrane is scarred. Tympanic membrane is not erythematous and not bulging.  Left Ear: No drainage or tenderness. Tympanic membrane is scarred, erythematous and bulging.  Left ear: ear canal- dried blood and erythema, TM-scared erythematous and bulging.   Eyes: Conjunctivae are normal. No scleral icterus.  Neck: Normal range of motion. Neck  supple.  Cardiovascular: Normal rate, regular rhythm and normal heart sounds.   Pulmonary/Chest: Effort normal and breath sounds normal. No respiratory distress. She has no wheezes. She has no rales.  Abdominal: Soft. She exhibits no distension. There is no tenderness.  Musculoskeletal: Normal range of motion.  Neurological: She is alert and oriented to person, place, and time.  Normal gait  Skin: Skin is warm and dry. She is not diaphoretic.  Nursing note and vitals reviewed.   Urgent Care Course   Clinical Course    Procedures (including critical care time)  Labs Review Labs Reviewed  POCT CBC W AUTO DIFF ([place])    Imaging Review No results found.   MDM   1. Left otitis media, unspecified otitis media type    Pt c/o low grade fever, Tmax 99.6*F for 5 days and Left ear pain that started today. Hx of acoustic neuroma and CSF leak.  Pt afebrile in UC. Appears well, non-toxic.  Denies new neurologic symptoms such as severe headache, change in vision, or change in balance. Denies nausea.   Exam c/w AOM of Left ear. Pt just started taking Keflex 250mg  today prescribed for 4x daily and ofloxacin ear drops.  She requested her WBC checked per PA at her neurologist office.  WBC- 13.2 up from 6.0 on 08/04/16 at [facility] per [place].  Possibly elevated from current AOM. Encouraged to keep taking the Keflex, however, advised to call her neurologist in morning to discuss labs she requested (copy given to pt) Advised pt go to emergency department tonight if she develops worsening symptoms including severe headache, dizziness, change in vision, or any other new concerning symptoms develop.    Noland Fordyce, PA-C 08/13/16 [phone]

## 2016-08-12 NOTE — Discharge Instructions (Signed)
°  Please call your neurologist, ENT, or Primary Care Provider tomorrow to give them the results of this evening's CBC lab work.   Please also let them know you started the Keflex today as they may want to keep you on the same antibiotic for a few more days to see if that helps or they may want to change to a stronger antibiotic.  If you develop worsening fever, severe headache, ear pain, change in balance, vomiting, change in vision, or any other new concerning symptoms develop this evening, please call 911 or have someone drive you to the closest emergency department for further evaluation and treatment of your symptoms.

## 2016-08-12 NOTE — ED Triage Notes (Signed)
Pt c/o temp 99.6 and non productive cough x 5 days. She reports LT ear pain x today. She started an old rx for keflex today. Hx of acoustic neuroma and CSF leak.

## 2016-08-20 ENCOUNTER — Encounter (INDEPENDENT_AMBULATORY_CARE_PROVIDER_SITE_OTHER): Payer: Self-pay | Admitting: Otology & Neurotology

## 2016-08-20 DIAGNOSIS — G9600 Cerebrospinal fluid leak, unspecified: Secondary | ICD-10-CM

## 2016-08-20 DIAGNOSIS — G9782 Other postprocedural complications and disorders of nervous system: Principal | ICD-10-CM

## 2016-08-20 NOTE — Progress Notes (Signed)
Telephone consult:  Persistent CSF leak.  Schedule for external auditory canal closure and Eustachian tube obliteration.

## 2016-09-23 ENCOUNTER — Encounter (HOSPITAL_COMMUNITY): Payer: Self-pay | Admitting: Emergency Medicine

## 2016-09-23 ENCOUNTER — Emergency Department (HOSPITAL_COMMUNITY)
Admission: EM | Admit: 2016-09-23 | Discharge: 2016-09-23 | Disposition: A | Discharge status: Home / Self Care | Payer: BLUE CROSS/BLUE SHIELD | Attending: Emergency Medicine | Admitting: Emergency Medicine

## 2016-09-23 DIAGNOSIS — G932 Benign intracranial hypertension: Secondary | ICD-10-CM | POA: Insufficient documentation

## 2016-09-23 DIAGNOSIS — Z79899 Other long term (current) drug therapy: Secondary | ICD-10-CM | POA: Insufficient documentation

## 2016-09-23 DIAGNOSIS — R51 Headache: Secondary | ICD-10-CM | POA: Insufficient documentation

## 2016-09-23 DIAGNOSIS — E039 Hypothyroidism, unspecified: Secondary | ICD-10-CM | POA: Insufficient documentation

## 2016-09-23 DIAGNOSIS — R519 Headache, unspecified: Secondary | ICD-10-CM

## 2016-09-23 LAB — BASIC METABOLIC PANEL
ANION GAP: 6 (ref 5–15)
BUN: 10 mg/dL (ref 6–20)
CALCIUM: 8.7 mg/dL — AB (ref 8.9–10.3)
CO2: 20 mmol/L — ABNORMAL LOW (ref 22–32)
Chloride: 108 mmol/L (ref 101–111)
Creatinine, Ser: 0.54 mg/dL (ref 0.44–1.00)
GLUCOSE: 99 mg/dL (ref 65–99)
Potassium: 3.5 mmol/L (ref 3.5–5.1)
SODIUM: 134 mmol/L — AB (ref 135–145)

## 2016-09-23 LAB — CBC WITH DIFFERENTIAL/PLATELET
BASOS ABS: 0.1 10*3/uL (ref 0.0–0.1)
Band Neutrophils: 9 %
Basophils Relative: 1 %
EOS ABS: 0.1 10*3/uL (ref 0.0–0.7)
Eosinophils Relative: 1 %
HCT: 33.6 % — ABNORMAL LOW (ref 36.0–46.0)
Hemoglobin: 10.8 g/dL — ABNORMAL LOW (ref 12.0–15.0)
LYMPHS ABS: 0.6 10*3/uL — AB (ref 0.7–4.0)
Lymphocytes Relative: 6 %
MCH: 27.6 pg (ref 26.0–34.0)
MCHC: 32.1 g/dL (ref 30.0–36.0)
MCV: 85.7 fL (ref 78.0–100.0)
MONO ABS: 0.8 10*3/uL (ref 0.1–1.0)
Monocytes Relative: 8 %
NEUTROS PCT: 75 %
Neutro Abs: 8.6 10*3/uL — ABNORMAL HIGH (ref 1.7–7.7)
PLATELETS: 260 10*3/uL (ref 150–400)
RBC: 3.92 MIL/uL (ref 3.87–5.11)
RDW: 13.7 % (ref 11.5–15.5)
WBC: 10.2 10*3/uL (ref 4.0–10.5)

## 2016-09-23 LAB — I-STAT BETA HCG BLOOD, ED (MC, WL, AP ONLY): I-stat hCG, quantitative: <5 m[IU]/mL (ref <5)

## 2016-09-23 MED ORDER — IBUPROFEN 200 MG PO TABS
600.0000 mg | ORAL_TABLET | Freq: Once | ORAL | Status: AC
  Administered 2016-09-23: 600 mg via ORAL
  Filled 2016-09-23: qty 3

## 2016-09-23 NOTE — ED Notes (Signed)
Bed: WA18 Expected date:  Expected time:  Means of arrival:  Comments: Hold for triage 2 

## 2016-09-23 NOTE — ED Provider Notes (Signed)
[facility] DEPT Provider Note   CSN: [id number] Arrival date & time: 09/23/16  1318     History   Chief Complaint Chief Complaint  Patient presents with  . Fever  . Neck Pain    HPI Katelyn Willis is a 35 y.o. female has history of acoustic neuroma presents referred to ED by her neurosurgeon with complaints of headache, stiff neck, and a fever of 99.9 for the last 2 days. She states that she has headache in the frontal region and occipital region but denies feeling of a headband or tension around her head. She states that it's diving and throbbing and describes it at rest has 4/10 and gets worse with of position to 10/10. She reports CSF leak since last Sunday and is not new finding she said since her first brain surgery. She states that she has undergone external auditory canal closing surgery which is supposed to eliminate CSF leak, however she states that she now gets CSF leak through her left nostril. She has been taking 800 mg of Advil last time she took it was at 2pm. She states that she was told to come to the ED to investigate whether it was a benign headache or if it was meningitis. She reports having a recent upper respiratory infection that has gotten better today. Her recent cough and sore throat have almost completely resolved today, though she still says she has a fever. 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 She denies any changes in vision, chest pain, shortness of breath, nausea, vomiting, urinary symptoms, changes in bowel movements.  HPI  Past Medical History:  Diagnosis Date  . Acoustic neuroma ([place])   . Acoustic neuroma ([place])   . Complication of anesthesia    unpleasant waking up, thrashing and crying  . CSF leak   . Hypothyroidism   . No pertinent past medical history     There are no active problems to display for this patient.   Past Surgical History:  Procedure Laterality Date  . BRAIN SURGERY     Benign  Neuroma 05/18/2013  . CESAREAN SECTION  2007, 2005   x 2 attempt  . CRANIOTOMY     removal of most of acoustic neuroma  . LAPAROSCOPY  Nov 2011   x2  . LAPAROSCOPY    . OOPHORECTOMY  2009    OB History    Gravida Para Term Preterm AB Living   3 2 2  0 1 2   SAB TAB Ectopic Multiple Live Births   0 0 0 0         Home Medications    Prior to Admission medications   Medication Sig Start Date End Date Taking? Authorizing Provider  acetaZOLAMIDE (DIAMOX) 500 MG capsule Take 500 mg by mouth 2 (two) times daily.   Yes Historical Provider, MD  NALTREXONE HCL PO Take 3 mg by mouth at bedtime.   Yes Historical Provider, MD    Family History No family history on file.  Social History Social History  Substance Use Topics  . Smoking status: Never Smoker  . Smokeless tobacco: Never Used  . Alcohol use No     Allergies   Wheat bran and Gluten meal   Review of Systems Review of Systems  Constitutional: Positive for fever. Negative for chills.  HENT: Negative for sore throat.   Eyes: Negative for pain and visual disturbance.  Respiratory: Negative for shortness of breath.   Cardiovascular: Negative for chest  pain and palpitations.  Gastrointestinal: Negative for abdominal pain, constipation, diarrhea, nausea and vomiting.  Genitourinary: Negative for difficulty urinating, dysuria and hematuria.  Musculoskeletal: Negative for arthralgias and back pain.  Skin: Negative for color change and rash.  Neurological: Positive for numbness (History of left sided paralysis). Negative for seizures, syncope and speech difficulty.     Physical Exam Updated Vital Signs BP 122/94   Pulse 81   Temp 98.3 F (36.8 C) (Oral)   Resp 18   SpO2 99%   Physical Exam  Constitutional: She is oriented to person, place, and time. She appears well-developed and well-nourished.  HENT:  Head: Normocephalic and atraumatic.    Right Ear: Hearing, external ear and ear canal normal.   Ears:  Nose: Nose normal. No epistaxis.  No foreign bodies.    Mouth/Throat: Uvula is midline, oropharynx is clear and moist and mucous membranes are normal. No oropharyngeal exudate, posterior oropharyngeal edema or posterior oropharyngeal erythema.  Eyes: Conjunctivae and EOM are normal. Pupils are equal, round, and reactive to light.  Neck: Normal range of motion. Neck supple.  Cardiovascular: Normal rate and normal heart sounds.   Pulmonary/Chest: Effort normal and breath sounds normal. No respiratory distress.  Abdominal: Soft.  Musculoskeletal: Normal range of motion.  Neurological: She is alert and oriented to person, place, and time.  Cranial Nerves:  III,IV, VI: ptosis not present on right, present on LEFT, extra-ocular movements intact bilaterally, direct and consensual pupillary light reflexes intact bilaterally V: facial sensation equal on V1 bilaterally. For V2 and V3: Facial sensation , jaw opening, and bite strength normal on right. Abnormal facial sensation and jaw opening on LEFT VII: eyebrow raise, eyelid close, smile, frown, pucker normal on right. Abnormal drooping on LEFT VIII: hearing grossly normal bilaterally  IX,X: palate elevation and swallowing intact XI: bilateral shoulder shrug and lateral head rotation equal and strong XII: midline tongue extension  Negative pronator drift, negative finger-to-nose, negative RAM, negative heel-to-shin, negative Romberg. Ambulating normally.  Skin: Skin is warm.  Psychiatric: She has a normal mood and affect. Her behavior is normal.  Nursing note and vitals reviewed.    ED Treatments / Results  Labs (all labs ordered are listed, but only abnormal results are displayed) Labs Reviewed  CBC WITH DIFFERENTIAL/PLATELET - Abnormal; Notable for the following:       Result Value   Hemoglobin 10.8 (*)    HCT 33.6 (*)    Neutro Abs 8.6 (*)    Lymphs Abs 0.6 (*)    All other components within normal limits  BASIC METABOLIC  PANEL - Abnormal; Notable for the following:    Sodium 134 (*)    CO2 20 (*)    Calcium 8.7 (*)    All other components within normal limits  I-STAT BETA HCG BLOOD, ED ([facility], [facility], [facility] ONLY)    EKG  EKG Interpretation None       Radiology No results found.  Procedures Procedures (including critical care time)  Medications Ordered in ED Medications - No data to display   Initial Impression / Assessment and Plan / ED Course  I have reviewed the triage vital signs and the nursing notes.  Pertinent labs & imaging results that were available during my care of the patient were reviewed by me and considered in my medical decision making (see chart for details).  Clinical Course   Patient also seen by Dr. Kathrynn Humble. Patient referred to ED by [facility] to investigate for meningitis. Patient has  a recent history of CSF buildup and leakage. Patient has been treated with Diamox in the past for some time and started back on it. On exam patient is afebrile with VSS except elevated HR. Heart and lung sounds are clear. Throat benign. No sensitivity to light. Neck is not TTP and no nuchal rigidity noted. Patient able to turn head with no difficulty. Patient has a history of left facial paralysis after first brain surgery otherwise normal neuro exam. Negative pronator drift, negative Romberg, negative finger-nose, negative heel-to-shin, able to ambulate without difficulty. Negative Brudzinski's and negative Kernig's. Will obtain basic labs for signs of infectious process.  Initial lab work shows no evidence of infectious process at this time and are otherwise normal. Patient is NAD, VSS, afebrile. Dr. Kathrynn Humble consulted [facility] team.   At shift change care was transferred to Dr. Kathrynn Humble who will follow pending studies, re-evaulate and determine disposition.    Final Clinical Impressions(s) / ED Diagnoses   Final diagnoses:  None    New Prescriptions New Prescriptions   No medications on file      [place], [place] 09/23/16 1734    Varney Biles, MD 09/23/16 2119

## 2016-09-23 NOTE — ED Triage Notes (Signed)
Per pt, states she had back surgery in July-CSF fluid from left nare which is a complication from the surgery she had-has had 3 surgeries to correct problem-states she woke up with headache, stiff neck and a fever of 99.5-she took 800 mg of Advil-was told by PCP to come to ED for eval

## 2016-09-23 NOTE — Discharge Instructions (Signed)
We don't think you have meningitis at this time. We suspect that the headaches are due to elevated intracranial pressure - so continue diamox.  Meningitis return precautions are as follow: Neck rigidity, worsening headaches, fevers, chills, confusion, seizures, vision changes.  TOMORROW I WORK AT [facility] FROM 7 AM - 4 PM. Return to the ER if you have any concerns and I will be happy to assist further.  If symptoms are not worsening, call Neurosurgery to see if they can help you with, what sounds like elevated intracranial pressure pain.

## 2016-09-23 NOTE — ED Provider Notes (Addendum)
Pt comes in with headaches. 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 Pt is having headache x 2 days. Pt had temp of 99 at max at home, and today she felt like her neck was stiff - so she came to the ER for r/o meningitis. Pt called her [facility] team with her symptoms and they had advised her to come here.  Pt on exam reports worsening headache when she is changing position, no nausea and no new focal neuro deficits or vision changes/ gait problems. Pt's pain is less when laying flat (3/10), and feels like a "beast" when she try to sit up. Pt has no meningimus on our exam, and she is moving her neck freely - in fact showing [place] full ROM of the neck laterally and with flexion/extention. Pt did take ibuprofen at 2 pm, 4 hours prior to my evaluation. No fevers.  I don't think pt has meningitis.  With patient's CSF leak, I called [facility] to ask if they sent the patient to the ER with meningitis concern due to her history - and the resident physician for ENT service (Dr. Sharol Given?) agreed that if patient doesn't appear clinically like meningitis then we probably don't have to take any further steps. I asked him to allow me to speak with Dr. Candis Schatz, who is the attending, and we were told that they will page her, but I never heard back from [facility] hour after that encounter.  Results from the ER workup discussed with the patient face to face and all questions answered to the best of my ability. I informed pt that I dont think she has meningitis currently - and I would be comfortable discharging her with strict ER return precautions. If she is not comfortable with the plan, then we will get a CT head and a diagnostic LP. Pt asked is she can get a MRI. I informed her that CT would be the test of choice, and that we  Don't have MRI capabilities at this hour and her symptoms didn't warrant an emergent MRI. Pt undertood the rational. Pt and I had a long  conversation in front her husband, and she has decided against LP.   Strict ER return precautions have been discussed, and patient is agreeing with the plan and is comfortable with the workup done and the recommendations from the ER.      Varney Biles, MD 09/23/16 2113

## 2016-09-24 ENCOUNTER — Other Ambulatory Visit: Payer: Self-pay

## 2016-09-30 ENCOUNTER — Other Ambulatory Visit: Payer: Self-pay

## 2017-03-05 ENCOUNTER — Other Ambulatory Visit: Payer: Self-pay

## 2017-04-13 ENCOUNTER — Other Ambulatory Visit: Payer: Self-pay

## 2017-08-06 ENCOUNTER — Other Ambulatory Visit: Payer: Self-pay

## 2018-01-14 ENCOUNTER — Other Ambulatory Visit: Payer: Self-pay

## 2018-02-03 ENCOUNTER — Telehealth (INDEPENDENT_AMBULATORY_CARE_PROVIDER_SITE_OTHER): Payer: Self-pay | Admitting: Otolaryngology

## 2018-02-03 NOTE — Telephone Encounter (Signed)
Reached out to patient and provided link to [employer] for images upload. Patient will also obtain operative reports and send in for review. Provided direct line for further questions.

## 2018-02-04 ENCOUNTER — Other Ambulatory Visit: Payer: Self-pay

## 2018-02-09 ENCOUNTER — Encounter (INDEPENDENT_AMBULATORY_CARE_PROVIDER_SITE_OTHER): Payer: Self-pay | Admitting: Otology & Neurotology

## 2018-02-09 DIAGNOSIS — G9601 Cranial cerebrospinal fluid leak, spontaneous: Secondary | ICD-10-CM

## 2018-02-09 DIAGNOSIS — G9600 Cerebrospinal fluid leak, unspecified: Secondary | ICD-10-CM | POA: Insufficient documentation

## 2018-02-10 ENCOUNTER — Telehealth (INDEPENDENT_AMBULATORY_CARE_PROVIDER_SITE_OTHER): Payer: Self-pay | Admitting: Otology & Neurotology

## 2018-02-10 NOTE — Telephone Encounter (Addendum)
Attempted to get in touch with patient to schedule surgery. No answer, voicemail was full and unable to leave voicemail message.     02-10-18@10 :01am spoke with patient scheduled surgery for 03-08-18. Emailed surgery letter below.  Email: [email or web address]    Dear Natalie FanningJulie,      This letter is to confirm your appointments for surgery. Please review the appointments below and feel free to call or email with any questions or concerns.    Pre-Anesthesia Phone Call Appointment:  Date: Monday May 13 at 7:30am, PST   A pre-anesthesia nurse will contact you directly. Please be sure to have a current list of medications to give to the nurse on the phone.     Surgery Information:  Date: Tuesday May 21 at 2:45pm, check-in 12:45pm   Location: [address] [place] [id number]  [facility], check-in at our kiosk located near the gift shop.     1. Do not eat or drink anything for 8 hours prior to surgery.   2. You will need a responsible adult to drive you home after surgery.   3. Do not take any blood thinners (Advil, aspirin, anti-inflammatories) for 10 days prior to surgery.   4. The hospital will be calling you 1-2 days prior to surgery to collect any co-payment/deductible that is due. If you have any questions about this payment, please call your insurance company directly.     (Friendly reminder: dates and times can and are subject to change)     Thank You,  Natalie LeaverViviana

## 2018-02-11 ENCOUNTER — Telehealth (INDEPENDENT_AMBULATORY_CARE_PROVIDER_SITE_OTHER): Payer: Self-pay | Admitting: Otology & Neurotology

## 2018-02-11 NOTE — Telephone Encounter (Signed)
Emailed patient appointment letter below.  Email: [email or web address]    Dear Raynelle FanningJulie,      This letter is to confirm your appointment. Please review the appointment below and feel free to call or email with any questions or concerns.    Post-Operative Appointment with Dr. Zachery ConchFriedman:   Date: Wednesday May 29 at 1:00pm   Location: [address] [employer], in the [facility], [place].    (Friendly reminder: dates and times can and are subject to change)     Thank You,  Trilby LeaverViviana

## 2018-02-22 ENCOUNTER — Other Ambulatory Visit (INDEPENDENT_AMBULATORY_CARE_PROVIDER_SITE_OTHER): Payer: Self-pay | Admitting: Otology & Neurotology

## 2018-02-22 ENCOUNTER — Telehealth (INDEPENDENT_AMBULATORY_CARE_PROVIDER_SITE_OTHER): Payer: Self-pay | Admitting: Otology & Neurotology

## 2018-02-22 LAB — EMMI , PATIENT SATISFACTION: HOSPITAL DISCHARGE EXPECTATIONS: EMMI Video Order Number: 16537655439

## 2018-02-22 NOTE — Telephone Encounter (Signed)
Emailed patient surgery letter below along with dropoff map.   Email: [email or web address]      Dear Natalie Rodriguez,      This letter is to confirm your appointments for surgery. Please review the appointments below and feel free to call or email with any questions or concerns.    Pre-Anesthesia Phone Call Appointment:  Date: Monday May 13 at 7:30am, PST   A pre-anesthesia nurse will contact you directly. Please be sure to have a current list of medications to give to the nurse on the phone.    Pre-Operative Appointment with Dr. Zachery Conch:   Date: Monday May 20 at 1:30pm     Location: [address] [employer], in the [facility], [place].       Surgery Information:  Date: Tuesday May 21 at 2:45pm, check-in 12:45pm    Location: [address] [place] [address]  [facility], check-in at our kiosk located near the gift shop.     1. Do not eat or drink anything for 8 hours prior to surgery.   2. You will need a responsible adult to drive you home after surgery.   3. Do not take any blood thinners (Advil, aspirin, anti-inflammatories) for 10 days prior to surgery.   4. The hospital will be calling you 1-2 days prior to surgery to collect any co-payment/deductible that is due. If you have any questions about this payment, please call your insurance company directly.        Post-Operative Appointment with Dr. Zachery Conch:  Date: Wednesday May 29 at 1:00pm   Location: [address] [employer], in the [facility], [place].       (Friendly reminder: dates and times can and are subject to change)       Thank You,  Trilby Leaver

## 2018-02-28 ENCOUNTER — Ambulatory Visit (INDEPENDENT_AMBULATORY_CARE_PROVIDER_SITE_OTHER): Payer: BLUE CROSS/BLUE SHIELD | Admitting: Primary Care

## 2018-02-28 ENCOUNTER — Encounter (INDEPENDENT_AMBULATORY_CARE_PROVIDER_SITE_OTHER): Payer: Self-pay | Admitting: Primary Care

## 2018-02-28 DIAGNOSIS — Z01818 Encounter for other preprocedural examination: Principal | ICD-10-CM

## 2018-02-28 NOTE — Anesthesia Preprocedure Evaluation (Addendum)
ANESTHESIA PRE-OPERATIVE EVALUATION  [facility] [place] call [phone]. AVS sent to email  Patient Information    Name: Natalie Rodriguez    MRN: [id number]    DOB: Mar 08, 1981    Age: 37 year old    Sex: female  Procedure(s):  post auricular obliteration of eustachian tube for csf leak      Pre-op Vitals:   There were no vitals taken for this visit.        Primary language spoken:  English    ROS/Medical History:       History of Present Illness: 37 yo female scheduled for post auricular obliteration for CSF leak post acoustic neuroma. PT reports 4 previous attempts. Hx fall with dental injury 2/2 balance issues    Front tooth recent bonding   6 front teeth are in a splint    General:  has fallen in the last 6 months ( tooth injury fell 2/2 balance ),  [address] Cardiovascular:  Able to walk city blocks and stairs  No exertional symptoms   Anesthesia History:  no history of anesthetic complications,  no family history of anesthetic complications,  Shaking on emergence    02/2017: MAC3 Grade 1 view Pulmonary:   negative pulmonary ROS  no sleep apnea,     Neuro/Psych:   negative for TIA/CVA,  no psychiatric history,  Acoustic neuroma   Hematology/Oncology:   hematologic/lymphatic negative      GI/Hepatic:  negative GI/hepatic ROS Infectious Disease:  negative for infectious disease  Meningitis 01/2018   Renal:  negative renal ROS   Endocrine/Other:  steriod use ( decadron for Meningitis LD April),     Pregnancy History:   Pediatrics:         Pre Anesthesia Testing (PCC/CPC) notes/comments:                 Physical Exam    Airway:      Cardiovascular:      Pulmonary:      Neuro/Neck/Skeletal/Skin:      Dental:      Abdominal:          Additional Clinical Notes:   Other Physical Exam Findings: np pe            Last  OSA (STOP BANG) Score:  No Data Recorded    Last OSA  (STOP) Score for   Has a physician diagnosed you with sleep apnea?: No  Do you use a CPAP at home?: No  Do you snore loudly (loud enough to be heard through a closed door)?: 0  Do you  often feel tired, fatigued or sleepy during the day?: 0  Has anyone observed that you stop breathing while you are sleeping?: 0  Have you ever been treated for high blood pressure?: 0  OSA total score (A score of 2 or more is high risk. Offer patient sleep study.): 0        Has a physician diagnosed you with sleep apnea?: No  Do you use a CPAP at home?: No  OSA total score (A score of 2 or more is high risk. Offer patient sleep study.): 0    Past Medical History:   Diagnosis Date    CSF leak from nose      Past Surgical History:   Procedure Laterality Date    acoustic neuroma  2017    4 attempted repairs     Social History     Tobacco Use    Smoking status: Never Smoker  Smokeless tobacco: Never Used   Substance Use Topics    Alcohol use: No     Frequency: Never    Drug use: Yes     Types: Marijuana     Comment: oil       No current outpatient medications on file.     No current facility-administered medications for this visit.      No Known Allergies    Labs and Other Data  No results found for: NA, SODIUM, K, CL, BICARB, BUN, CREAT, GLU, [place]  No results found for: AST, ALT, GGT, LDH, ALK, TP, ALB, TBILI, DBILI  No results found for: WBC, RBC, HGB, HCT, MCV, MCHC, RDW, PLT, PLCTEL, MPV, MPVH, SEG, LYMPHS, MONOS, EOS, BASOS  No results found for: INR, PTT  No results found for: ARTPH, ARTPO2, ARTPCO2    Anesthesia Plan:  Risks and Benefits of Anesthesia      I have prescribed the anesthetic plan:                  ASA 2 (Mild systemic disease)

## 2018-02-28 NOTE — Patient Instructions (Addendum)
PREOPERATIVE SURGICAL INFORMATION    Your surgery is currently scheduled at [facility] hospital/facility/department on 03/08/18  With a planned report time of 1245        FACILITY ADDRESS   [facility], [address] [place], [place] [address]   The drop off point is the west entrance of [facility]   Please use valet parking service at the [facility] entrance. It is available from 5 am to 5 pm for same rates as self parking   If you arrive before 5 am or after 5 pm please have driver drop you off at [facility] entrance park in [facility] Structure    Surgery Information:  Date: Tuesday May 21 at 2:45pm, check-in 12:45pm    Location: [address] [place] [address]  [facility], check-in at our kiosk located near the gift shop.           QUESTIONS  If you have any questions between now and the day of surgery, please do not hesitate to call:   [facility]: [phone]       DAY OF SURGERY ARRIVAL TIME:    If you are a woman of child bearing age, please note that you may be asked to give a urine sample upon check-in.    On the day of your Surgery/Procedure, please arrive at the time and location provided by your Surgeon/PreOp Team.  If you have any questions regarding your arrival time, please call:   Preoperative Surgical Admissions at [facility]: [phone]    MEDICATION INSTRUCTIONS:     MEDICATIONS TO STOP 7 DAYS BEFORE SURGERY/PROCEDURE:   PLEASE HOLD ASPIRIN AND ALL NSAIDS (non-steroidal anti-inflammatory drugs) or similar drugs SUCH AS advil, aleve, motrin, ibuprofen, relafen, lodine, feldene, Diclofenac, voltaren, indomethacin, naproxen, celebrex, Mobic.    Tylenol (Acetaminophen) products are safe if needed   PLEASE HOLD ALL: vitamins, supplements, herbs & fish oil.          REGULAR PRESCRIPTION MEDICATIONS:   Regular prescription medications SHOULD be taken on the day of  surgery with sips of water    AFTER YOUR VISIT WITH [place], IF YOU START TAKING A NEW MEDICATION BEFORE SURGERY, PLEASE CALL us TO MAKE SURE IT IS SAFE TO TAKE & WON'T EFFECT YOUR SURGERY.          TO DO LIST:      If you have SLEEP APNEA: DO NOT use sedatives or drink alcohol while on pain medications & IF you use a CPAP machine bring your mask & Tubing with you the day of surgery.     EATING/DRINKING   PLEASE DO NOT EAT OR DRINK ANYTHING AFTER MIDNIGHT THE NIGHT BEFORE SURGERY. THIS INCLUDES CANDY, GUM, AND MINTS       Preparing for your Surgery:  If you have cold/flu symptoms that do not improve over the next 2-3 days (worsening cough, sinus congestion, development of fever/chills), please call [facility] at [phone]. Having a cold increases your risk for severe lung complications during anesthesia and can result in prolonged hospitalization.   Please wear clean loose-fitting clothes. Please remove jewelry   Bring a picture ID and your insurance card, and be prepared to pay your deductible or co-insurance by cash, check, or credit card when you arrive.   If you are going home after surgery, please make sure to arrange for an ADULT to drive you home. If you do not do  so, your surgery may be cancelled.    On The Day of Your Surgery:    Check in at the location designated above.   You will meet your Anesthesia and surgery teams before surgery   Once surgery is over, you will wake up in the recovery room, where you will be able to see your friends/family.   Once your time in the recovery room is complete, you will either go home or be admitted as planned.   If you go home, someone will need to stay with you for the first 24 hours after surgery.     Additional information about what to expect before & after surgery is available online at:  [email or web address]    Or by searching You-tube for [place] before surgery and [place] after surgery    You medical records are  available to you at [email or web address]  Select create account.    Additional information about what to expect before & after surgery is available online at:  [email or web address]

## 2018-03-02 ENCOUNTER — Telehealth (INDEPENDENT_AMBULATORY_CARE_PROVIDER_SITE_OTHER): Payer: Self-pay | Admitting: Otolaryngology

## 2018-03-02 DIAGNOSIS — G9600 Cerebrospinal fluid leak, unspecified: Secondary | ICD-10-CM

## 2018-03-02 NOTE — Telephone Encounter (Signed)
Will plan on nasal endoscopy, possible right sided NSF, poss abdominal fat graft.

## 2018-03-07 ENCOUNTER — Encounter (INDEPENDENT_AMBULATORY_CARE_PROVIDER_SITE_OTHER): Payer: Self-pay | Admitting: Otology & Neurotology

## 2018-03-07 ENCOUNTER — Ambulatory Visit (INDEPENDENT_AMBULATORY_CARE_PROVIDER_SITE_OTHER): Payer: BLUE CROSS/BLUE SHIELD | Admitting: Otology & Neurotology

## 2018-03-07 VITALS — BP 97/68 | HR 112 | Temp 97.7°F | Resp 18 | Ht 66.0 in | Wt 116.0 lb

## 2018-03-07 DIAGNOSIS — G9601 Cranial cerebrospinal fluid leak, spontaneous: Secondary | ICD-10-CM

## 2018-03-07 MED ORDER — DOXYCYCLINE HYCLATE PO: ORAL | Status: AC

## 2018-03-07 NOTE — Progress Notes (Signed)
Dear Dr No Pcp, Per Patient,  Thank you very much for your referral of this patient for CSF leak. Please see the visit summary below. Do not hesitate to contact us with any questions.  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 She has undergone ET obliteration via a postauricular route and also through transnasal route using a nasal septal flap (03/04/17). She presents before planned revision postauricular and possible transnasal ET obliteration.         PAST MEDICAL AND SURGICAL HISTORY     Negative for trauma, stroke or ototoxic exposure (aminoglycoside, platinum chemotherapy, radiation)  Past Medical History:   Diagnosis Date    CSF leak from nose      Past Surgical History:   Procedure Laterality Date    acoustic neuroma  2017    4 attempted repairs       FAMILY HISTORY     family history is not on file.    SOCIAL HISTORY     Social History     Socioeconomic History    Marital status: Married     Spouse name: Not on file    Number of children: Not on file    Years of education: Not on file    Highest education level: Not on file   Occupational History    Not on file   Social Needs    Financial resource strain: Not on file    Food insecurity:     Worry: Not on file     Inability: Not on file    Transportation needs:     Medical: Not on file     Non-medical: Not on file   Tobacco Use    Smoking status: Never Smoker    Smokeless tobacco: Never Used   Substance and Sexual Activity    Alcohol use: No     Frequency: Never    Drug use: Yes     Types: Marijuana     Comment: oil    Sexual activity: Not Currently   Lifestyle    Physical activity:     Days per week: Not on file     Minutes per session: Not on file    Stress: Not on file   Relationships    Social connections:     Talks on  phone: Not on file     Gets together: Not on file     Attends religious service: Not on file     Active member of club or organization: Not on file     Attends meetings of clubs or organizations: Not on file     Relationship status: Not on file    Intimate partner violence:     Fear of current or ex partner: Not on file     Emotionally abused: Not on file     Physically abused: Not on file     Forced sexual activity: Not on file   Other Topics Concern    Not on file   Social History Narrative    Not on file       MEDICATIONS     Outpatient Medications Marked as Taking for the 03/07/18 encounter (Office Visit) with Agustin Cree,  MD   Medication Sig Dispense Refill    DOXYCYCLINE HYCLATE PO           ALLERGIES  Patient has no known allergies.    REVIEW OF SYSTEMS     Positive for: see HPI  Denies: fevers, chills, weight loss, cough, dysphagia, voice changes, dyspnea, chest pain, facial weakness, numbness, diarrhea, urinary problems, depression    PHYSICAL EXAMINATION  BP 97/68 (BP Location: Right arm, BP Patient Position: Sitting, BP cuff size: Regular)    Pulse 112    Temp 97.7 F (36.5 C) (Oral)    Resp 18    Ht  (1.676 m)    Wt 52.6 kg (116 lb)    BMI 18.72 kg/m   General: alert, awake, in NAD   Head: normocephalic  Eyes: EOMI, PERRL, no nystagmus  Microscopic Ear Exam:      Left:EAC closed  Neuro: CNII-VII,IX-XII intact except for left hearing loss and left facial weakness.  Nose: no deformity, no drainage  Mouth: oral mucosa without lesion or masses, tonsils 0+, uvula midline  Neck: soft, supple, no masses  CV: no peripheral edema  Pulm: normal wob, no stridor  MSK: moves all extremities  Psych: normal mood/affect  Heme: no ecchymosis  Skin: dry without rash        DIAGNOSTIC STUDIES  Audiogram none      Imaging  CT reviewed      ASSESSMENT AND PLAN     Kyrsten Deleeuw has suspected ongoing left temporal CSF leak.    Recommendations:  ET obliteration. Risks: failure and meningitis.      Thank you for  the referral. Do not hesitate to contact with any questions.    Seen with Dr Zachery Conch

## 2018-03-08 ENCOUNTER — Encounter (HOSPITAL_COMMUNITY): Admission: RE | Disposition: A | Discharge status: Home Health | Payer: Self-pay | Attending: Otology & Neurotology

## 2018-03-08 ENCOUNTER — Other Ambulatory Visit: Payer: Self-pay

## 2018-03-08 ENCOUNTER — Ambulatory Visit (HOSPITAL_COMMUNITY): Payer: BLUE CROSS/BLUE SHIELD | Admitting: Primary Care

## 2018-03-08 ENCOUNTER — Observation Stay
Admission: RE | Admit: 2018-03-08 | Discharge: 2018-03-09 | Disposition: A | Discharge status: Home / Self Care | Payer: BLUE CROSS/BLUE SHIELD | Attending: Otology & Neurotology | Admitting: Otology & Neurotology

## 2018-03-08 ENCOUNTER — Ambulatory Visit (HOSPITAL_BASED_OUTPATIENT_CLINIC_OR_DEPARTMENT_OTHER): Payer: BLUE CROSS/BLUE SHIELD | Admitting: Anesthesiology

## 2018-03-08 DIAGNOSIS — G9600 Cerebrospinal fluid leak, unspecified: Secondary | ICD-10-CM

## 2018-03-08 DIAGNOSIS — Z9181 History of falling: Secondary | ICD-10-CM | POA: Insufficient documentation

## 2018-03-08 DIAGNOSIS — Z8661 Personal history of infections of the central nervous system: Secondary | ICD-10-CM | POA: Insufficient documentation

## 2018-03-08 DIAGNOSIS — G9601 Cranial cerebrospinal fluid leak, spontaneous: Secondary | ICD-10-CM | POA: Insufficient documentation

## 2018-03-08 LAB — BASIC METABOLIC PANEL, BLOOD
Anion Gap: 11 mmol/L (ref 7–15)
BUN: 15 mg/dL (ref 6–20)
Bicarbonate: 20 mmol/L — ABNORMAL LOW (ref 22–29)
Calcium: 8.6 mg/dL (ref 8.5–10.6)
Chloride: 111 mmol/L — ABNORMAL HIGH (ref 98–107)
Creatinine: 0.56 mg/dL (ref 0.51–0.95)
GFR: >60 mL/min
Glucose: 88 mg/dL (ref 70–99)
Potassium: 3.3 mmol/L — ABNORMAL LOW (ref 3.5–5.1)
Sodium: 142 mmol/L (ref 136–145)

## 2018-03-08 LAB — HEMOGRAM, BLOOD
Hct: 30.5 % — ABNORMAL LOW (ref 34.0–45.0)
Hgb: 9.5 gm/dL — ABNORMAL LOW (ref 11.2–15.7)
MCH: 26.5 pg (ref 26.0–32.0)
MCHC: 31.1 g/dL — ABNORMAL LOW (ref 32.0–36.0)
MCV: 85 um3 (ref 79.0–95.0)
MPV: 12.5 fL — ABNORMAL HIGH (ref 9.4–12.4)
Plt Count: 213 10*3/uL (ref 140–370)
RBC: 3.59 10*6/uL — ABNORMAL LOW (ref 3.90–5.20)
RDW: 13.4 % (ref 12.0–14.0)
WBC: 4.1 10*3/uL (ref 4.0–10.0)

## 2018-03-08 LAB — PROTHROMBIN TIME, BLOOD
INR: 1.3
PT,Patient: 13.8 s — ABNORMAL HIGH (ref 9.7–12.5)

## 2018-03-08 LAB — TYPE & SCREEN
ABO/RH: A POS
Antibody Screen: NEGATIVE

## 2018-03-08 LAB — ABO/RH CONFIRMATION: ABO/RH: A POS

## 2018-03-08 LAB — APTT, BLOOD: PTT: 34 s (ref 25–34)

## 2018-03-08 SURGERY — SINUS SURGERY, ENDOSCOPIC
Site: Face

## 2018-03-08 SURGERY — CANALOPLASTY, EAR
Anesthesia: General | Site: Face | Laterality: Left | Wound class: Class II (Clean Contaminated)

## 2018-03-08 MED ORDER — LIDOCAINE HCL (PF) 1 % IJ SOLN
0.1000 mL | INTRAMUSCULAR | Status: DC | PRN
Start: 2018-03-08 — End: 2018-03-08

## 2018-03-08 MED ORDER — MIDAZOLAM HCL 2 MG/2ML IJ SOLN
INTRAMUSCULAR | Status: DC | PRN
Start: 2018-03-08 — End: 2018-03-08
  Administered 2018-03-08 (×2): 1 mg via INTRAVENOUS

## 2018-03-08 MED ORDER — MORPHINE SULFATE 2 MG/ML IJ SOLN
2.0000 mg | INTRAMUSCULAR | Status: DC | PRN
Start: 2018-03-08 — End: 2018-03-09

## 2018-03-08 MED ORDER — NEOSTIGMINE METHYLSULFATE 10 MG/10ML IV SOLN
INTRAVENOUS | Status: DC | PRN
Start: 2018-03-08 — End: 2018-03-08
  Administered 2018-03-08: 3.5 mg via INTRAVENOUS

## 2018-03-08 MED ORDER — LACTATED RINGERS IV SOLN
INTRAVENOUS | Status: DC
Start: 2018-03-08 — End: 2018-03-08
  Administered 2018-03-08: 14:00:00 via INTRAVENOUS

## 2018-03-08 MED ORDER — ONDANSETRON HCL 4 MG/2ML IV SOLN
INTRAMUSCULAR | Status: DC | PRN
Start: 2018-03-08 — End: 2018-03-08
  Administered 2018-03-08 (×3): 4 mg via INTRAVENOUS

## 2018-03-08 MED ORDER — SODIUM CHLORIDE 0.9 % IV SOLN
12.5000 mg | Freq: Once | INTRAVENOUS | Status: DC | PRN
Start: 2018-03-08 — End: 2018-03-08

## 2018-03-08 MED ORDER — FENTANYL CITRATE (PF) 100 MCG/2ML IJ SOLN
25.0000 ug | INTRAMUSCULAR | Status: DC | PRN
Start: 2018-03-08 — End: 2018-03-08
  Administered 2018-03-08 (×2): 25 ug via INTRAVENOUS
  Filled 2018-03-08: qty 2

## 2018-03-08 MED ORDER — PROPOFOL 1000 MG/100ML IV EMUL
INTRAVENOUS | Status: DC | PRN
Start: 2018-03-08 — End: 2018-03-08
  Administered 2018-03-08 (×2): 25 ug/kg/min via INTRAVENOUS

## 2018-03-08 MED ORDER — CEFAZOLIN SODIUM 1 GM IJ SOLR
INTRAMUSCULAR | Status: DC | PRN
Start: 2018-03-08 — End: 2018-03-08
  Administered 2018-03-08 (×2): 1000 mg via INTRAVENOUS

## 2018-03-08 MED ORDER — THROMBIN 20000 UNIT EX SOLR
INTRAMUSCULAR | Status: DC | PRN
Start: 2018-03-08 — End: 2018-03-08
  Administered 2018-03-08: 19:00:00 via TOPICAL

## 2018-03-08 MED ORDER — LIDOCAINE HCL 20 MG/ML IV INJECTION WRAPPED RECORD
INTRAVENOUS | Status: DC | PRN
Start: 2018-03-08 — End: 2018-03-08
  Administered 2018-03-08: 50 mg via INTRAVENOUS

## 2018-03-08 MED ORDER — LIDOCAINE-EPINEPHRINE 1 %-1:100000 IJ SOLN
INTRAMUSCULAR | Status: DC | PRN
Start: 2018-03-08 — End: 2018-03-08
  Administered 2018-03-08: 10 mL
  Administered 2018-03-08: 9 mL
  Administered 2018-03-08: 10 mL

## 2018-03-08 MED ORDER — OXYCODONE HCL 10 MG OR TABS
10.0000 mg | ORAL_TABLET | ORAL | Status: DC | PRN
Start: 2018-03-08 — End: 2018-03-09

## 2018-03-08 MED ORDER — BACITRACIN 500 UNIT/GM EX OINT (CUSTOM)
TOPICAL_OINTMENT | CUTANEOUS | Status: DC | PRN
Start: 2018-03-08 — End: 2018-03-08
  Administered 2018-03-08: 1 via TOPICAL

## 2018-03-08 MED ORDER — GLYCOPYRROLATE 1 MG/5ML IJ SOLN
INTRAMUSCULAR | Status: DC | PRN
Start: 2018-03-08 — End: 2018-03-08
  Administered 2018-03-08: .7 mg via INTRAVENOUS

## 2018-03-08 MED ORDER — OXYCODONE HCL 5 MG OR TABS
5.0000 mg | ORAL_TABLET | ORAL | Status: DC | PRN
Start: 2018-03-08 — End: 2018-03-09

## 2018-03-08 MED ORDER — ACETAMINOPHEN 10 MG/ML IV SOLN
1000.0000 mg | Freq: Four times a day (QID) | INTRAVENOUS | Status: DC
Start: 2018-03-09 — End: 2018-03-09
  Administered 2018-03-08 – 2018-03-09 (×2): 1000 mg via INTRAVENOUS
  Filled 2018-03-08 (×2): qty 100

## 2018-03-08 MED ORDER — PHENYLEPHRINE HCL 10 MG/ML INJECTION WRAPPED RECORD
INTRAMUSCULAR | Status: DC | PRN
Start: 2018-03-08 — End: 2018-03-08
  Administered 2018-03-08: 20 ug/min via INTRAVENOUS
  Administered 2018-03-08: 10 ug/min via INTRAVENOUS
  Administered 2018-03-08: 20 ug via INTRAVENOUS

## 2018-03-08 MED ORDER — HYDROMORPHONE HCL 1 MG/ML IJ SOLN
0.5000 mg | INTRAMUSCULAR | Status: DC | PRN
Start: 2018-03-08 — End: 2018-03-08

## 2018-03-08 MED ORDER — LACTATED RINGERS IV SOLN
INTRAVENOUS | Status: DC | PRN
Start: 2018-03-08 — End: 2018-03-08
  Administered 2018-03-08: 16:00:00 via INTRAVENOUS

## 2018-03-08 MED ORDER — SODIUM CHLORIDE 0.9 % IV SOLN
INTRAVENOUS | Status: DC | PRN
Start: 2018-03-08 — End: 2018-03-08
  Administered 2018-03-08: .3 ug via INTRAVENOUS
  Administered 2018-03-08: .2 ug/kg/min via INTRAVENOUS
  Administered 2018-03-08: .3 ug/kg/min via INTRAVENOUS
  Administered 2018-03-08: .2 ug/kg/min via INTRAVENOUS
  Administered 2018-03-08: .3 ug/kg/min via INTRAVENOUS
  Administered 2018-03-08: .1 ug/kg/min via INTRAVENOUS
  Administered 2018-03-08: .4 ug/kg/min via INTRAVENOUS

## 2018-03-08 MED ORDER — ONDANSETRON HCL 4 MG/2ML IV SOLN
4.0000 mg | Freq: Four times a day (QID) | INTRAMUSCULAR | Status: DC | PRN
Start: 2018-03-08 — End: 2018-03-09

## 2018-03-08 MED ORDER — ONDANSETRON HCL 4 MG/2ML IV SOLN
4.0000 mg | Freq: Once | INTRAMUSCULAR | Status: DC | PRN
Start: 2018-03-08 — End: 2018-03-08

## 2018-03-08 MED ORDER — PHENYLEPHRINE DILUTION 100 MCG/ML IJ SOLN
INTRAVENOUS | Status: DC | PRN
Start: 2018-03-08 — End: 2018-03-08
  Administered 2018-03-08: 100 ug via INTRAVENOUS

## 2018-03-08 MED ORDER — OXYCODONE HCL 5 MG OR TABS
5.0000 mg | ORAL_TABLET | ORAL | Status: DC | PRN
Start: 2018-03-08 — End: 2018-03-09
  Administered 2018-03-08: 5 mg via ORAL
  Filled 2018-03-08: qty 1

## 2018-03-08 MED ORDER — FENTANYL CITRATE (PF) 250 MCG/5ML IJ SOLN
INTRAMUSCULAR | Status: DC | PRN
Start: 2018-03-08 — End: 2018-03-08
  Administered 2018-03-08 (×2): 50 ug via INTRAVENOUS
  Administered 2018-03-08: 100 ug via INTRAVENOUS

## 2018-03-08 MED ORDER — DEXAMETHASONE SODIUM PHOSPHATE 4 MG/ML IJ SOLN (CUSTOM)
INTRAMUSCULAR | Status: DC | PRN
Start: 2018-03-08 — End: 2018-03-08
  Administered 2018-03-08: 4 mg via INTRAVENOUS

## 2018-03-08 MED ORDER — SODIUM CHLORIDE 0.9 % IV SOLN
INTRAVENOUS | Status: DC
Start: 2018-03-08 — End: 2018-03-09
  Administered 2018-03-08: 23:00:00 via INTRAVENOUS

## 2018-03-08 MED ORDER — PROPOFOL 200 MG/20ML IV EMUL
INTRAVENOUS | Status: DC | PRN
Start: 2018-03-08 — End: 2018-03-08
  Administered 2018-03-08: 50 mg via INTRAVENOUS
  Administered 2018-03-08: 30 mg via INTRAVENOUS
  Administered 2018-03-08: 100 mg via INTRAVENOUS
  Administered 2018-03-08: 50 mg via INTRAVENOUS

## 2018-03-08 MED ORDER — ROCURONIUM BROMIDE 100 MG/10ML IV SOLN
INTRAVENOUS | Status: DC | PRN
Start: 2018-03-08 — End: 2018-03-08
  Administered 2018-03-08 (×2): 50 mg via INTRAVENOUS

## 2018-03-08 MED ORDER — DIPHENHYDRAMINE HCL 50 MG/ML IJ SOLN
12.5000 mg | Freq: Once | INTRAMUSCULAR | Status: DC | PRN
Start: 2018-03-08 — End: 2018-03-08
  Administered 2018-03-08: 12.5 mg via INTRAVENOUS
  Filled 2018-03-08: qty 50

## 2018-03-08 MED ORDER — OXYCODONE HCL 5 MG OR TABS
5.0000 mg | ORAL_TABLET | Freq: Once | ORAL | Status: DC | PRN
Start: 2018-03-08 — End: 2018-03-08

## 2018-03-08 MED ORDER — OXYMETAZOLINE HCL 0.05 % NA SOLN
NASAL | Status: DC | PRN
Start: 2018-03-08 — End: 2018-03-08
  Administered 2018-03-08: 2 mL via NASAL

## 2018-03-08 MED ORDER — NALOXONE HCL 0.4 MG/ML IJ SOLN
0.1000 mg | INTRAMUSCULAR | Status: DC | PRN
Start: 2018-03-08 — End: 2018-03-08

## 2018-03-08 MED ORDER — PROCHLORPERAZINE 25 MG RE SUPP
12.5000 mg | Freq: Two times a day (BID) | RECTAL | Status: DC | PRN
Start: 2018-03-08 — End: 2018-03-09

## 2018-03-08 MED ORDER — HYDROCODONE-ACETAMINOPHEN 5-325 MG OR TABS
1.0000 | ORAL_TABLET | Freq: Four times a day (QID) | ORAL | 0 refills | Status: AC | PRN
Start: 2018-03-08 — End: ?
  Filled 2018-03-08: qty 15, 4d supply, fill #0

## 2018-03-08 MED ORDER — FENTANYL CITRATE (PF) 100 MCG/2ML IJ SOLN
50.0000 ug | INTRAMUSCULAR | Status: DC | PRN
Start: 2018-03-08 — End: 2018-03-08
  Filled 2018-03-08: qty 2

## 2018-03-08 MED ORDER — ACETAMINOPHEN 325 MG PO TABS
975.0000 mg | ORAL_TABLET | Freq: Once | ORAL | Status: AC
Start: 2018-03-08 — End: 2018-03-08
  Administered 2018-03-08: 975 mg via ORAL
  Filled 2018-03-08: qty 3

## 2018-03-08 MED ORDER — EPHEDRINE SULFATE 50 MG/ML IJ SOLN
INTRAMUSCULAR | Status: DC | PRN
Start: 2018-03-08 — End: 2018-03-08
  Administered 2018-03-08 (×3): 5 mg via INTRAVENOUS

## 2018-03-08 MED ORDER — MEPERIDINE HCL 25 MG/ML IJ SOLN
12.5000 mg | INTRAMUSCULAR | Status: DC | PRN
Start: 2018-03-08 — End: 2018-03-08

## 2018-03-08 SURGICAL SUPPLY — 112 items
3M STERI-DRAPE 1000 (Drapes/towels) ×3 IMPLANT
BANDAGE, GAUZE ROLL, KERLIX 4.5" X 4.1YD (Dressings/packing) ×3 IMPLANT
BLADE BEAVER 7200 TYPANOPLASTY 2.5MM W/60 DEG BEVEL DOWN (Knives/Blades) ×3 IMPLANT
BLADE BEAVER MINI-BLADE 6400 (Knives/Blades) ×3 IMPLANT
BLADE QUADCUT 4.3MM X 13CM (Drills/Bits/Burs/Taps/Reamers) ×3 IMPLANT
BLADE RAD 60 EXTREME 4MM (Knives/Blades) IMPLANT
BLADE TYMPANOPLASTY ANGLED 60 DEG 2.5MM (Knives/Blades) IMPLANT
BUR ANSPACH CUTTING FLUTED BALL 2.0MM (Drills/Bits/Burs/Taps/Reamers) IMPLANT
BUR ANSPACH CUTTING FLUTED BALL 3.0MM (Drills/Bits/Burs/Taps/Reamers) IMPLANT
BUR ANSPACH CUTTING FLUTED BALL 4MM S-4B-G1 (Drills/Bits/Burs/Taps/Reamers) IMPLANT
BUR ANSPACH CUTTING FLUTED BALL 6MM S-6B-G1 (Drills/Bits/Burs/Taps/Reamers) IMPLANT
BUR ANSPACH DIAMOND BALL 2MM STD S-2SD-G1 (Drills/Bits/Burs/Taps/Reamers) IMPLANT
BUR ANSPACH DIAMOND BALL 3.0MM, EXT LGTH S-3D-G1 (Drills/Bits/Burs/Taps/Reamers) ×3 IMPLANT
BUR ANSPACH DIAMOND BALL 3MM STD S-3SD-G1 (Drills/Bits/Burs/Taps/Reamers) IMPLANT
BUR ANSPACH DIAMOND BALL 4MM STD S-4D-G1 (Drills/Bits/Burs/Taps/Reamers) IMPLANT
BUR ELITE ROUND DIAMOND COARSE 1.0MM 5820-013-040 (Drills/Bits/Burs/Taps/Reamers) ×3 IMPLANT
CAUTERY BOVIE ROCKER SWITCH PENCIL (Cautery) ×3 IMPLANT
CAUTERY TIP EDGE BLADE ELECTRODE 2.75", INSULATED (Cautery) ×3 IMPLANT
CAUTERY TIP EDGE BLADE ELECTRODE 4.0", INSULATED (Cautery) IMPLANT
CAUTERY TIP EDGE BLADE ELECTRODE 6.5", INSULATED (Cautery) ×3 IMPLANT
CLEANER CAUTERY TIP DEVON, MICRO-TIP (Misc Medical Supply) ×3 IMPLANT
CONTAINER PRECISION SPECIMEN, 4OZ- STERILE (Misc Medical Supply) IMPLANT
CORD VALLEYLAB BIPOLAR FORCEP- SINGE USE (Cautery) ×3 IMPLANT
COVER LIGHT HANDLE RIGID (2 PACK) (Misc Surgical Supply) ×3 IMPLANT
DERMABOND ADVANCE 0.7ML (Suture) ×3 IMPLANT
DISH PETRI 100 X 15MM STERILE (Misc Medical Supply) ×3 IMPLANT
DISH PETRI STERILE (Misc Medical Supply) IMPLANT
DRAPE HFS EAR LAMINATED 46" X 54" (Drape/Gowns/Gloves/Pack)
DRAPE IOBAN 2 ANTIMICROBIAL 23" X 17" (Misc Surgical Supply) ×1
DRAPE MICROSCOPE ZEISS MOVENA TYPE 79 (Drapes/towels) IMPLANT
DRAPE ORTHO BAR SHEET 100 X 60" (Drapes/towels) ×3 IMPLANT
DRAPE ORTHO U 76" X 120" (Drapes/towels) ×9 IMPLANT
DRAPE POUCH STERI INST 7X11 (Drapes/towels) ×1
DRAPE POUCH STERI INST 7X11 1018 (Drapes/towels) ×2 IMPLANT
DRESSING 3" X 8" NON-ADHERENT (Dressings/packing) IMPLANT
DRESSING CURITY STRIPS NON-ADHESIVE 7.6 CM X 7.6 CM (Dressings/packing) IMPLANT
DRESSING EAR GLASSCOCK ADULT (Dressings/packing) ×3 IMPLANT
DRESSING MEPILEX BORDER LITE 2 X 5 IN (Dressings/packing) ×3 IMPLANT
DRESSING NASAL MEROCEL 8CM (Dressings/packing) ×12
DRESSING SPONGE CURITY 4X4 16 PLY (Dressings/packing) ×3 IMPLANT
DRESSING TEGADERM HP 2.375" X 2.375" (Dressings/packing) IMPLANT
DRESSING TELFA 3" X 4" STRL (Dressings/packing) IMPLANT
DRESSING TELFA STRL 3" X 8" (Dressings/packing) ×3
ELECTRODE HANDSWITCH SUCTION COAGULATOR 10FR 6" VALLEYLAB (Cautery) IMPLANT
ELECTRODE SUBDERMAL EMG, 2 CHANNEL 82-27103 (Cautery) IMPLANT
FILM IOBAN 2 ANTIMICROBIAL 23" X 17" (Misc Surgical Supply) ×2 IMPLANT
FORCEP INSULATED ADSON 5" BIPOLAR 1MM TIPS, SMOOTH (Cautery) ×3
GAUZE 3 X 8 (Dressings/packing)
GLOVE BIOGEL PI INDICATOR SIZE 6.5 (Gloves/gowns) ×9 IMPLANT
GLOVE BIOGEL PI INDICATOR SIZE 8 (Gloves/gowns) ×3 IMPLANT
GLOVE BIOGEL PI ULTRATOUCH SIZE 6 (Gloves/gowns) ×6
GLOVE SURGEON BIOGEL SIZE 7.5 (Gloves/gowns) ×6 IMPLANT
GLOVE SURGICAL BIOGEL SIZE 8 (Gloves/gowns) ×3 IMPLANT
GOWN SIRUS XLG BLUE, AAMI LVL 4 (Gloves/Gowns) IMPLANT
GOWN SURGICAL ULTRA LG BLUE, AAMI LVL 3 (Gloves/Gowns) ×12 IMPLANT
GOWN SURGICAL ULTRA XL BLUE, AAMI LVL 3 (Gloves/Gowns) ×6
HEMOSTAT SURGICEL NU-KNIT 3" X 4" (Hemostatic agents/wax/sealants-absorbable) ×3 IMPLANT
JMC ONLY- O.R. CAMERA COVER LIGHT, STERILE (Misc Surgical Supply) ×3 IMPLANT
MARKER DEVON UTILITY WITH LABELS- STERILE (Misc Medical Supply) IMPLANT
MARKER SECURELINE SURG SKIN (Misc Medical Supply) IMPLANT
MASTISOL LIQUID ADHESIVE 2/3C (Misc Surgical Supply) IMPLANT
NASAL PACKING XEROGEL 4.0CM X 2.4CM X .3CM (Dressings/packing)
NEEDLE BD HYPO 27G X 1.25" (Needles/punch/cannula/biopsy) ×3 IMPLANT
NEEDLE PROTECT 18G X 1.5" (Needles/punch/cannula/biopsy) ×3
NEEDLE PROTECT 25G X 1.5" (Needles/punch/cannula/biopsy) ×3 IMPLANT
PACK HEAD AND NECK CUST STRL (Drape/Gowns/Gloves/Pack)
PAD GROUND VALLEYLAB REM ADULT E7507 (Misc Surgical Supply) IMPLANT
PATTIE SURGICAL CODMAN 1/2"" X 2"" (Misc Medical Supply) IMPLANT
PREP TRAY WET SKIN SCRUB KENDALL (Prep Solutions) ×3 IMPLANT
PROTECTOR ULNAR NERVE PAD, YELLOW (Misc Medical Supply) ×6 IMPLANT
SLEEVE SCD KNEE LG (Misc Medical Supply) ×3 IMPLANT
SLEEVE SCD KNEE MEDIUM (Misc Medical Supply) ×3 IMPLANT
SOLUTION IRR BAG .9% N/S 3000ML (Non-Pharmacy Meds/Solutions) IMPLANT
SOLUTION IRR POUR BTL 0.9% NS 1000ML (Non-Pharmacy Meds/Solutions) ×3 IMPLANT
SOLUTION IRR POUR BTL H20 1000ML (Non-Pharmacy Meds/Solutions) ×3 IMPLANT
SOLUTION IV 0.9% NS 250ML (Non-Pharmacy Meds/Solutions) IMPLANT
SPONGE NEURO 1/2" X 3" STERILE (10 SURGICAL PATTIES) (Sponges) ×6 IMPLANT
SPONGE SURGIFOAM LARGE (GELFOAM) (Hemostatic agents/wax/sealants-absorbable) ×3
SPONGE SURGIFOAM SM (GELFOAM) (Hemostatic agents/wax/sealants-absorbable) ×3
SPONGE VISTEC 4 X 4 16PLY, X-RAY (Dressings/packing)
STAPLER PROXIMATE SKIN 35 WIDE (Staplers and staple reloads) ×3 IMPLANT
STORZ TUBING CLEARVISION IRRIGATION (Misc Medical Supply) IMPLANT
STRIP MEDI-STRIP SKIN CLOSURE 1/2 X 4" (Dressings/packing) IMPLANT
STRIP SKIN CLOSURE 1/2 X 4 (Dressings/packing)
SURGICAL PACK - HEAD AND NECK CUST STRL (Drape/Gowns/Gloves/Pack) IMPLANT
SURGICAL PACK BASIC MAJOR SET-UP (Procedure Packs/kits) ×6
SUTURE MONOCRYL PLUS 4-0 27" PS-2 MCP426 (Suture) ×3 IMPLANT
SUTURE MONOSOF 3-0 18" C-14 (Suture) ×3 IMPLANT
SUTURE PERMA-HAND SILK 2-0 18" FS 685H (Suture) ×6 IMPLANT
SUTURE PERMA-HAND SILK 2-0 30" SH K833H (Suture) IMPLANT
SUTURE PLAIN GUT 4-0 18" SC-1 1824 (Suture) IMPLANT
SUTURE PLAIN GUT 5-0 18" PC-1 1915, FAST ABSORB (Suture) IMPLANT
SUTURE PROLENE 3-0 18" PS-2 (Suture) IMPLANT
SUTURE VICRYL 3-0 18" PS-2 J497G (Suture)
SUTURE VICRYL PLUS 2-0 18" CT-1 (Suture) ×3
SUTURE VICRYL PLUS 2-0 18" CT-1 VCP839D (Suture) ×6 IMPLANT
SUTURE VICRYL PLUS 2-0 18" SH (D9911) (Suture) ×3 IMPLANT
SWAB COTTON TIP STERILE 6 (Misc Medical Supply) ×6 IMPLANT
SYRINGE 10CC LL CONTRL (Misc Medical Supply) ×6 IMPLANT
SYRINGE EAR BULB 2 OZ (Misc Medical Supply) ×3
SYRINGE HYPO LL 10CC (Needles/punch/cannula/biopsy) IMPLANT
SYRINGE HYPO LL 20CC (Needles/punch/cannula/biopsy) IMPLANT
SYRINGE HYPO LL 3CC (Needles/punch/cannula/biopsy) IMPLANT
SYRINGE HYPO LL 60CC (Needles/punch/cannula/biopsy) ×3 IMPLANT
TOWELS OR BLUE 4-PACK STERILE, DISPOSABLE (Drapes/towels) ×9 IMPLANT
TRAY FOLEY SURESTEP LUBRI-SIL I.C.16FR URIMETER, LF (Lines/Drains) ×3 IMPLANT
TUBING CONN SUCTN 3/16 X 10 (Tubing/suction)
TUBING SILICONE WITH CONNECTOR 5/16" STERILE (Tubing/Suction) ×3 IMPLANT
TUBING SUCTION IRRIGATION LEVINE BX/6 (Tubing/Suction) ×3 IMPLANT
TUBING SUCTION MEDI-VAC 9/32" X 20' (Tubing/suction) ×9 IMPLANT
WAX BONE 2.5 GRAM HEMOSTATIC (Hemostatic agents/wax/sealants-absorbable) ×3 IMPLANT
WIPE ANTI-FOG STERILE (Misc Surgical Supply) ×3 IMPLANT

## 2018-03-08 NOTE — Plan of Care (Incomplete)
Problem: Promotion of Perioperative Health and Safety  Goal: Promotion of Health and Safety of the Perioperative Patient  The patient remains safe, receives treatment appropriate to the surgical intervention and patient's physiological needs and is discharged or transferred to the appropriate level of care.    Information below is the current care plan.  Outcome: Progressing

## 2018-03-08 NOTE — RN OR/Procedure Note (Signed)
Started patient to allow primary rn to discharge other patient.  Pt c/o nausea with anesthesia at bedside. Gave benadryl per mar and anesthesia suggestion while at bedside.

## 2018-03-08 NOTE — Anesthesia Postprocedure Evaluation (Signed)
Anesthesia Transfer of Care Note    Patient: [name]    Procedures performed: Procedure(s):  LEFT EUSTACHIAN TUBE OBLITERATION, CLOSURE OF EAR CANAL, ABDOMINAL FAT GRAFT  NASAL ENDOSCOPY, NASOSEPTAL FLAP    Vital signs: stable           Anesthesia Post Note    Patient: Natalie Rodriguez    Procedure(s) Performed: Procedure(s):  LEFT EUSTACHIAN TUBE OBLITERATION, CLOSURE OF EAR CANAL, ABDOMINAL FAT GRAFT  NASAL ENDOSCOPY, NASOSEPTAL FLAP      Final anesthesia type: General    Patient location: PACU    Post anesthesia pain: adequate analgesia    Mental status: awake, alert  and oriented    Airway Patent: Yes    Last Vitals:   Vitals:    03/08/18 2120   BP: 110/65   Pulse: 96   Resp: 16   Temp: 35.8 C   SpO2: 100%       Post vital signs: stable    Hydration: adequate    N/V:no    Anesthetic complications: no    Plan of care per primary team.

## 2018-03-08 NOTE — Interdisciplinary (Signed)
Pt: Natalie Rodriguez, Natalie Rodriguez at [facility], pt is anxious, unable to sleep, may we get an order for SLeeping, pt requesting meds to help her sleep/rest. Thank you.     Awaiting for MD order

## 2018-03-08 NOTE — H&P (Signed)
HISTORY & PHYSICAL - INTERVAL ASSESSMENT    **ONLY TO BE USED IN ADDITION TO A HISTORY & PHYSICALMarland Kitchen    Dajsha Massaro  [id number]      This interval assessment is required for History & Physical completed less than 30 days prior to the admission or surgery. A History & Physical completed more than 30 days prior to the admission or surgery must be repeated.    Current Medical Status:  Unchanged    Medications / Allergies:  Unchanged    Review of Systems:  Unchanged    Physical Examination:  I have examined the patient today.  Unchanged    Laboratory or Clinical Data:  Unchanged    Modifications of Initial Care Plan:  Unchanged        Faylene Kurtz     03/08/18     2:43 PM

## 2018-03-08 NOTE — Op Note (Signed)
OPERATIVE REPORT     DATE OF OPERATION: 03/08/2018    PREOPERATIVE DIAGNOSIS : left temporal CSF leak    POSTOPERATIVE DIAGNOSIS: Same    PROCEDURE:   Left postauricular approach for Eustachian tube obliteration  Abdominal fat graft    ATTENDING SURGEON: Dr Billy Coast    ASSISTANTS(s):   Dr Glynn Octave     ANESTHESIA: general with ETT    ESTIMATED BLOOD LOSS: 5 ml     COMPLICATIONS: None.    DISPOSITION: Stable to PACU.     FINDINGS:   1. CSF welling in the middle ear, suspected to be draining from a defect in the middle fossa dura in the expected location of the tegmen tympani      INDICATIONS FOR PROCEDURE:   Karrina Lye is a 37 year old patient with a history of a large left acoustic neuroma which has been resected in subtotal fashion in the past. Postoperatively, she underwent Eustachian tube obliteration for treatment of a persistent CSF leak. 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    DESCRIPTION OF THE PROCEDURE:   The patient was brought into the operating room and a "time-out" was performed. General anesthesia was induced and patient was intubated without complications. The patient was rotated 180 degrees away from anesthesia.    Hair was shaved from the left postauricular scalp. The postauricular incision site was infiltrated with 8 mL of 1% lidocaine with 1:100,000 epinephrine.     A curvilinear post auricular incision was performed sharply with a #10 blade. An anteriorly based skin flap was elevated, and one of the existing plates was removed from the wound. Upon entrance into the bony external auditory canal, CSF was encountered. Using the operating microscope for visualization, the ear canal and middle ear were cleared of scar tissue, and the round window,  promontory, and Eustachian tube orifice were identified. Nu-Knit buttered with bone wax was used to obliterate the Eustachian tube. In addition, small pieces of fibrous tissue from the postauricular wound bed were harvested and then used to further pack the Eustachian tube. Additional bone wax was packed in order to create a final seal over the Eustachian tube orifice.     At this point, a horizontal skin incision was created in the left lower quadrant, which had been prepped and draped in sterile fashion before the case began. Small pieces of subcutaneous fat were harvested. The incision was closed with simple interrupted Vicryl for the deep dermis and running subcuticular Monocryl.     The pieces of the abdominal fat were packed into the middle ear and ear canal. Additional postauricular fibrous tissue was harvested and packed into the lateral bony EAC.The post-auricular skin was reapproximated with 3-0 vicryl sutures. A running locking 3-0 Nylon was used to close epidermis.     Care of the patient was turned over to Dr. Fritz Pickerel, whose intranasal portion of the procedure will be dictated separately.       The attending surgeon, Dr. Zachery Conch, was present for all key portions of the procedure.

## 2018-03-09 ENCOUNTER — Other Ambulatory Visit: Payer: Self-pay

## 2018-03-09 LAB — HEMOGRAM, BLOOD
Hct: 28.4 % — ABNORMAL LOW (ref 34.0–45.0)
Hgb: 8.9 gm/dL — ABNORMAL LOW (ref 11.2–15.7)
MCH: 26.6 pg (ref 26.0–32.0)
MCHC: 31.3 g/dL — ABNORMAL LOW (ref 32.0–36.0)
MCV: 85 um3 (ref 79.0–95.0)
MPV: 12.6 fL — ABNORMAL HIGH (ref 9.4–12.4)
Plt Count: 210 10*3/uL (ref 140–370)
RBC: 3.34 10*6/uL — ABNORMAL LOW (ref 3.90–5.20)
RDW: 13.6 % (ref 12.0–14.0)
WBC: 9.2 10*3/uL (ref 4.0–10.0)

## 2018-03-09 LAB — BASIC METABOLIC PANEL, BLOOD
Anion Gap: 9 mmol/L (ref 7–15)
BUN: 12 mg/dL (ref 6–20)
Bicarbonate: 22 mmol/L (ref 22–29)
Calcium: 8.3 mg/dL — ABNORMAL LOW (ref 8.5–10.6)
Chloride: 111 mmol/L — ABNORMAL HIGH (ref 98–107)
Creatinine: 0.52 mg/dL (ref 0.51–0.95)
GFR: >60 mL/min
Glucose: 102 mg/dL — ABNORMAL HIGH (ref 70–99)
Potassium: 3.6 mmol/L (ref 3.5–5.1)
Sodium: 142 mmol/L (ref 136–145)

## 2018-03-09 MED ORDER — MELATONIN 5 MG OR TABS
5.0000 mg | ORAL_TABLET | Freq: Every evening | ORAL | Status: DC
Start: 2018-03-09 — End: 2018-03-09

## 2018-03-09 MED ORDER — ACETAMINOPHEN 325 MG PO TABS
650.0000 mg | ORAL_TABLET | ORAL | 0 refills | Status: AC | PRN
Start: 2018-03-09 — End: 2019-03-04
  Filled 2018-03-09: qty 30, 3d supply, fill #0

## 2018-03-09 MED ORDER — SODIUM CHLORIDE 0.9 % IV SOLN
3000.0000 mg | Freq: Four times a day (QID) | INTRAVENOUS | Status: DC
Start: 2018-03-09 — End: 2018-03-09
  Administered 2018-03-09: 3000 mg via INTRAVENOUS
  Filled 2018-03-09: qty 3000

## 2018-03-09 NOTE — Progress Notes (Signed)
Date: Mar 09, 2018    Patient Name: Natalie Rodriguez    Medical Record #: [id number]          Hospital Day:   0 days - Admitted on: 03/08/2018    DAILY PROGRESS NOTE -- OTOLARYNGOLOGY HEAD & NECK SURGERY    Subjective & Overnight events:  NAEON. Admitted overnight for observation    Feels well this morning. Nasal packing is uncomfortable for her.    Exam:  BP 107/60 (BP Location: Left arm, BP Patient Position: Semi-Fowlers)    Pulse 78    Temp 97.6 F (36.4 C)    Resp 18    Ht  (1.702 m)    Wt 50.5 kg (111 lb 4.8 oz)    LMP 02/16/2018 (Exact Date)    SpO2 100%    Breastfeeding? No    BMI 17.43 kg/m   General: Up in bed, NAD, voice strong  HEENT:  Head dressing in place, mastoid flat, nasal packing secured in place and tied in place    WBC 4.1 (05/21) HGB 9.5* (05/21) PLT 213 (05/21)    HCT 30.5* (05/21)      Na 142 (05/21) CL 111* (05/21) BUN 15 (05/21) GLU   88 (05/21)   K 3.3* (05/21) CO2 20* (05/21) Cr 0.56 (05/21)      PT 13.8* (05/21) PTT 34 (05/21)   INR 1.3 (05/21)       Intake & Output:     Intake/Output Summary (Last 24 hours) at 03/09/2018 5409  Last data filed at 03/08/2018 2246  Gross per 24 hour   Intake 2520 ml   Output 700 ml   Net 1820 ml       Medications:  Current Facility-Administered Medications   Medication    acetaminophen (OFIRMEV) IV injection 1,000 mg    melatonin tablet 5 mg    morphine injection 2 mg    ondansetron (ZOFRAN) injection 4 mg    oxyCODONE (ROXICODONE) tablet 10 mg    oxyCODONE (ROXICODONE) tablet 5 mg    oxyCODONE (ROXICODONE) tablet 5 mg    prochlorperazine (COMPAZINE) suppository 12.5 mg    sodium chloride 0.9% infusion     Assessment & Plan:  37 year old year-old female with left temporal CSF leak from the middle cranial fossa s/p ETD obliteration with fat grafting and nasal endoscopy with 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 septal artery and nasopalatine a) 522/19 with Dr. Zachery Conch and Dr. Rodman Key. No evidence of CSF leak or reaccumulation  post-auricularly today.    - Abx while nasal packing in place  - Maintain head wrap  - OK for diet  - HOB elevated  - No straining, Bowel Regimen  - Anticipate discharge today    Seen and examined with Dr. Lurlean Horns, MD PhD  Resident Physician, PGY-2  Otolaryngology-Head & Neck Surgery  [facility]    Personal pager:  [phone]  [facility] Service pager:  [phone]  [facility] Service pager:  [phone]

## 2018-03-09 NOTE — Interdisciplinary (Signed)
03/09/18 1124   Discharge Plan   Living Arrangements Family Member   Additional Services Not Applicable   Supplies/Services  Not Applicable   Patient/Family Engaged in Discharge Planning Yes   Family/Caregiver's Assessed for Readiness, willingness, and ability to provide or support self-management activities   Respite Care Not Applicable   Patient/Family Are In Agreement With Discharge Plan Yes   Patient Has Decision Making Capacity Yes   Patient/Family/Legal/Surrogate Decision Maker Has Been Given Options And Choice In The Selection of Post-Acute Care Providers Yes   Public Health Clearance Needed Not Applicable   If Medicare or Medicare Managed Care: Important Message from Medicare Given   If Medicare or Medicare Managed Care: Important Message from Medicare Given Not Applicable   MOON   MOON Provided to Patient Not Applicable   Discharge Planning Needs   Planned living arrangements after discharge: Hotel with sister, then home with family   Primary family / caregiver name and contact information: Bagg,Brent SPOUSE [phone]    Advance Directive: Yes   Does this patient have CM discharge planning needs? No   Does this patient have SW discharge planning needs? No   Discharge Buyer, retail Family/Friend   Final Discharge Destination   Final Discharge Destination Home   Patient ready for discharge to hotel. Patient's sister will provide transportation to hotel and will remain available for any post-hospital needs, then patient and sister will return home to [place]. No further CM needs identified at this time.

## 2018-03-09 NOTE — Interdisciplinary (Signed)
Pt discharged from floor ambulating with sister.

## 2018-03-09 NOTE — Interdisciplinary (Signed)
03/09/18 1056   Initial Assessment   CM Initial Assessment Completed   Patient Information   Where was the patient admitted from? Home   Prior to Level of Function Ambulatory/Independent with ADL's   Assistive Device Not applicable   Prior [employer] None   Primary Caretaker(s) Spouse/Partner   Primary Contact Name, Number and Relationship Palencia,Brent SPOUSE [phone]    Permission to [place] Unemployed   Financial Resources Not Designer, television/film set History Not [place] Affiliation No   Discharge Planning   Living Arrangements Spouse /Significant Other   Type of Residence [place] No   Additional Services Not Applicable   Barriers to Discharge Awaiting clinical improvement   Patient/Family Engaged in Discharge Planning Yes   Patient Has Decision Making Capacity Yes   Patient/Family/Legal/Surrogate Decision Maker Has Been Given Options And Choice In The Selection of Post-Acute Care Providers Yes   Family/Caregiver's Assessed for Readiness, willingness, and ability to provide or support self-management activities   Respite Care Not Applicable   Public Health Clearance Needed Not Applicable   Social Worker Consult   Do you need to see a Education officer, museum?  No   Readmission Risk Assessment   Readmission Within 30 Days of Discharge No   Admission Was Planned   Patient Explanation  Not Applicable   Family Explanation Not Applicable   Recent Hospitalizations (Within Last 6 Months) Yes   High Risk For Readmission No   MOON   MOON Provided to Patient Not Applicable     Verified patient's address on facesheet. Pt will return to [facility] in [place], then eventually to home address upon discharge.    SNF/HH: [facility] in [place]    PCP: None, declined referral    Pharmacy: [facility]    DCP: Hotel with sister, then home with family    EDD: 03/09/18    LACE+ Score: 10    Met with patient at bedside. Patient  lives in a single level home with spouse, where she ambulates independently, without the use of assistive devices, and denies the need for any additional DME at this time. Patient's sister will provide transportation back to [facility] in [place] and will remain available for any post-hospital needs, then patient and sister will return home to [place]. No CM needs identified at this time. CM will remain available for any further needs.

## 2018-03-09 NOTE — Interdisciplinary (Signed)
Pt: Natalie Rodriguez at [facility] pt with BP: 87/53, HR:65, T:99.1 pt on IVF NS at 37ml/hr do you want to order IV Bolus? thank you.     Awaiting for MD order

## 2018-03-09 NOTE — Plan of Care (Signed)
Problem: Promotion of Health and Safety  Goal: Promotion of Health and Safety  The patient remains safe, receives appropriate treatment and achieves optimal outcomes (physically, psychosocially, and spiritually) within the limitations of the disease process by discharge.    Information below is the current care plan.  Outcome: Discharged Date Met: 03/09/18   03/09/18 0745 03/09/18 1048   Patient /Family stated Goal   Patient /Family stated Goal Go home --    Adult/Peds Plan of Care   Guidelines --  Inpatient Nursing Guidelines   Individualized Interventions/Recommendations #1 --  Ambulation encouraged   Outcome Evaluation (rationale for progressing/not progressing) every shift --  A&O. VSS. Tolerating diet. Pain controlled; denies need for medication. No BM. Up ad lib. Discharge instructions given to pt with pt verbalization of understanding. All belongings returned to pt. PICC dressing changed and PICC maintained upon discharge per order. Pt awaiting prescription delivery from [place]. Will continue to monitor.

## 2018-03-09 NOTE — Discharge Instructions (Signed)
Remove the dressing in 3 days after surgery along with the outer ear cotton ball. Expect some reddish brown drainage from the ear. Ok to shower but keep the inside of the right ear dry (you can use a cotton ball covered in vaseline just at the outside of the ear canal to keep water out during shower). Call if you have symptoms of sudden hearing loss, facial weakness, fever or bright red bleeding that does not stop. Take tylenol for mild to moderate pain. Take Norco 1 tablet every 4 hrs as needed for severe pain.  Do NOT exceed  of all sources of tylenol (acetaminophen) on a daily basis. Followup in clinic on 03/16/18 for a wound check with Drs. Zachery Conch and [place].

## 2018-03-09 NOTE — Discharge Summary (Signed)
[facility]   Head & Neck Surgery  Discharge Summary      Date Today:  03/09/18   Patient Name:  Natalie Rodriguez  MRN:    [id number]  PCP:   No Pcp, Per Patient  Service:   Head & Neck Surgery  Attending:  Agustin Cree, MD      Principal Diagnosis (required):  CSF leak from nose [G96.0]    Hospital Problem List (required):    Active Hospital Problems    Diagnosis    *CSF leak from nose [G96.0]    CSF leak [G96.0]      Resolved Hospital Problems   No resolved problems to display.     Past Medical History:   Diagnosis Date    CSF leak from nose      Past Surgical History:   Procedure Laterality Date    acoustic neuroma  2017    4 attempted repairs       Additional Hospital Diagnoses ("rule out" or "suspected" diagnoses, etc.):    None    Principal Procedure During This Hospitalization (required):    Procedure(s):  LEFT EUSTACHIAN TUBE OBLITERATION, CLOSURE OF EAR CANAL, ABDOMINAL FAT GRAFT - Wound Class: Class II (Clean Contaminated) - Incision Closure: Deep and Superficial Layers  NASAL ENDOSCOPY, NASOSEPTAL FLAP - Wound Class: Class II (Clean Contaminated) - Incision Closure: N/A on 03/08/2018    Other Procedures Performed During This Hospitalization (required):    Imaging Narratives  No results found.      Reason for Admission to the Hospital / History of Present Illness:    [name] is a 37 year old female with:   has a past medical history of CSF leak from nose.     She was admitted with CSF leak from nose [G96.0]   She underwent a LEFT EUSTACHIAN TUBE OBLITERATION, CLOSURE OF EAR CANAL, ABDOMINAL FAT GRAFT for CSF leak from nose [G96.0] by Dr. Agustin Cree, MD.     Hospital Course by Problem (required):    Elmon Else underwent a LEFT EUSTACHIAN TUBE OBLITERATION, CLOSURE OF EAR CANAL, ABDOMINAL FAT GRAFT for CSF leak from nose [G96.0] by Dr. Agustin Cree, MD.    The patient tolerated the procedure well. Post-operatively, she was admitted to the hospital for recovery.    On the day of  discharge, necessary arrangements were made with the case manager and social worker prior to discharge.      Tests Outstanding at Discharge Requiring Follow Up:    Pathology evaluation of:   ID Type Source Tests Collected by Time Destination   A : HARDWARE FROM LEFT HEAD Other Other PATHOLOGY TISSUE EXAM Agustin Cree, MD 03/08/2018 1659        Discharge Condition (required):  Stable.    Key Physical Exam Findings at Discharge:  General: Up in bed, NAD, voice strong  HEENT:              Head dressing in place, mastoid flat, nasal packing secured in place and tied in place      Discharge Diet:    Diet Regular    Discharge Medications:       What To Do With Your Medications      START taking these medications      Add'l Info   acetaminophen 325 MG tablet  Commonly known as:  TYLENOL  Take 2 tablets (650 mg) by mouth every 4 hours as needed for Mild Pain (Pain Score 1-3).  Quantity:  30 tablet  Refills:  0     HYDROcodone-acetaminophen 5-325 MG tablet  Commonly known as:  NORCO  Take 1 tablet by mouth every 6 hours as needed for Moderate Pain (Pain Score 4-6) or Severe Pain (Pain Score 7-10).   Quantity:  15 tablet  Refills:  0        CONTINUE taking these medications      Add'l Info   DOXYCYCLINE HYCLATE PO   Refills:  0           Where to Get Your Medications      These medications were sent to [facility]  [id number], [place] [address]    Hours:  Mon-Fri: 8:30am-7:00pm; Sat-Sun & Holidays: 9:00am-5:00pm Phone:  [phone]    acetaminophen 325 MG tablet   HYDROcodone-acetaminophen 5-325 MG tablet         Allergies:     No Known Allergies    Discharge Disposition:  Home     Discharge Code Status: Orders Placed This Encounter      Full Code - Call Code      This code status is not changed from the time of admission.    Follow Up Appointments:    Scheduled appointments:  Future Appointments   Date Time Provider Department Center   03/15/2018  8:30 AM PROVIDER B [facility] ANES [facility]  [facility]   03/16/2018  1:00 PM Agustin Cree, MD [facility]   03/16/2018  1:30 PM Moody Bruins, MD [facility]       For appointments requested for after discharge that have not yet been scheduled, refer to the Post Discharge Referrals section of the After Visit Summary.    Your activity level at home should be: as much non-strenuous exercise or activity as you can tolerate.       Keep the head of the bed elevated 30 degrees or more.   Keep your head above your heart.   No heavy lifting >5 pounds.   No straining with bowel movements.   Do not blow your nose.      Your medication list is located on this After Visit Summary in the Current Discharge Medication List section. Your nurse will review this information with you before you leave the hospital.     Reasons to call 911 or contact a doctor urgently:   Shortness of breath or difficulty breathing.   Chest pains or palpitations.    You should contact either your primary care physician or your hospital physician for any of the following reasons:    Increased or uncontrolled pain.   Redness or swelling at the surgical (or wound) site.   Discharge or leakage from the surgical (or wound) site.   Fevers or chills.   Bleeding from surgical (or wound) site.    If you have any questions about your hospital care, your medications, or if you have new or concerning symptoms soon after going home from the hospital, and you need to contact your hospital physician, your hospital physician can be contacted in the following manner:     [facility] operator at [phone] and ask to speak with the HEAD & NECK SURGERY doctor on-call.    Once you are able to see your primary care physician (PCP), your PCP will then be responsible for further medication refills, or appointment referrals.    Discharging Physician's Contact Information:  Nurse, learning disability at  [phone].

## 2018-03-09 NOTE — Plan of Care (Signed)
Problem: Promotion of Health and Safety  Goal: Promotion of Health and Safety  The patient remains safe, receives appropriate treatment and achieves optimal outcomes (physically, psychosocially, and spiritually) within the limitations of the disease process by discharge.    Information below is the current care plan.  Outcome: Progressing   03/08/18 2237 03/09/18 0308   Patient /Family stated Goal   Patient /Family stated Goal to go home --    Adult/Peds Plan of Care   Guidelines --  Inpatient Nursing Guidelines   Individualized Interventions/Recommendations #1 --  Provided snacks Gello tolerated well, took all meds with no c/o difficulty with swallowing. Aspiration precaution maintained.   Individualized Interventions/Recommendations #2 (if applicable) --  No bleeding observed at nasal incision with clip s/p Hematoma removal, will cont to monitor, educated pt to avoid putting pressure on incision site, verbalized understanding.    Individualized Interventions/Recommendations #3 (if applicable) --  Safety and fall prevention implemented, bed in low position, nonskid socks placed, refused bed alarm, placed call light within reach at all times. Kept clean and dry.    Individualized Interventions/Recommendations #4 (if applicable) --  Provided pt comfortable environment to promote rest and sleep. door closed to promote rest. Pain meds administered as ordered and effective.    Outcome Evaluation (rationale for progressing/not progressing) every shift --  Pt is sleeping at this time, no s/s of discomfort, no s/s of aspiration. Able to make needs known to staff and attended. Will cont to monitor.

## 2018-03-09 NOTE — Op Note (Signed)
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 Same    PROCEDURES PERFORMED:  1. Bilateral nasal endoscopy  2. Left partial inferior turbinectomy  3. Posterior septectomy  4. Resection of the medial aspect of the eustachian tube  5. 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 And nasopalatine n.)    ATTENDING SURGEON: Moody Bruins, MD    RESIDENT SURGEON: Jerilynn Som, MD    ANESTHESIA: General.    FINDINGS:  1. Small hole (~1-mm) at posterior edge of prior left nasoseptal flap    INDICATIONS:  1. 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 After this repair she developed another episode of meningitis. Today she comes for exploration of the repair site and packing of the eustachian tube from a post-auricular approach (see Dr. Santiago Glad op note for details).    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 After this repair she developed another episode of meningitis. Today she comes for exploration of the repair site and packing of the eustachian tube from a post-auricular approach. The patient had a discussion of the risks associated with surgery including failure to improve, need for revision surgery. The patient was also informed of the alternative of continuing with observation with risk of recurrent meningitis. All of the patients questions were answered and then the patient signed the informed consent.    PROCEDURE IN DETAIL:  After general anesthesia and  endotracheal intubation was achieved by the anesthesia team the procedures began. A time-out was performed confirming the correct patient, procedure, medications, scans and prophylactic antibiotics were given. All concerns were addressed prior to proceeding.    The patient's nasal cavity and nasopharynx was examined with a 0-degree endoscope. An ever so subtle divot was observed at the posterior edge of the prior repair where the posterior aspect of the torus remained. This was not clearly a hole, but raised suspicion enough to warrant exam with a 70-degree scope aimed laterally. The 70-degree scope clearly demonstrated a hole (no larger than 1-mm). Hovering a suction over this hole brought thick mucoid discharge out of the opening. No clear CSF was seen, but clearly trapped mucous was within this opening and the prior surgery had not successfully sealed off the nasopharynx from the lateral skull base defect. Therefore, we proceeded with a repair. Exam of the left nasal cavity also revealed an open left sphenoidotomy without edema or discharge and a clear left middle meatus. The right right nasal cavity was then examined with the endoscope and this anatomy was unoperated with normal turbinates, middle and superior meatuses without signs of inflammation.    The posterior the posterior aspect of the inferior turbinate was resected with an extended needle tip cautery and endoscopic scissors. The mucosa over the medial pterygoid plate was resected along with the remnant posterior aspect of the cartilaginous torus using a combination of extended tip cautery and endoscopic scissors. The remnant medial aspect of the eustachian tube was then identified with a small ball  probed and denuded with the needle tip cautery.    The patient had no viable pedicle on the left side secondary to her prior nasoseptal flap. Therefore, a right nasoseptal flap was raised. This was done with the extended tip cautery. The right sphenoid os  was first identified. An axial incision was taken from the natural os anteriorly, taking care to stay >1-cm inferior to the olfactory cleft to preserve olfactory mucosa. An inferior incision was made along the superior aspect of the choana and taken anteriorly along the inferior aspect of the nasal floor. The superior and inferior incision were connect anteriorly taking care to both include the septal body but also stopping short of the squamocolumnar junction by 1.5-cm. The flap was then elevated free of the underlying cartilage and bone with a suction freer. The flap was rotated into the nasopharynx. The flap needed to reach to the contralateral nasopharynx and therefore the posterior septum was removed in order to get a comfortable lie of the flap. The posterior aspect of the septum was therefore removed with a combination of a jansen-middleton and high-speed drill to remove sphenoid rostrum. The flap was then rotated over the previously created defect in the lateral aspect of the nasopharynx and on the pterygoid plate. This was then supported with a combination of gelfoam squares and bilateral merocel secured anteriorly to the columella.    EBL: 15cc

## 2018-03-10 ENCOUNTER — Telehealth (HOSPITAL_BASED_OUTPATIENT_CLINIC_OR_DEPARTMENT_OTHER): Payer: Self-pay

## 2018-03-10 NOTE — Addendum Note (Signed)
Addendum  created 03/10/18 1242 by Carmon Ginsberg, MD    Review and Sign - Ready for Procedure

## 2018-03-10 NOTE — Telephone Encounter (Addendum)
Transitional Telephonic Nurse (TTN ) Post-Discharge Follow-Up Phone Call   Triggered Alert: Symptoms and Med questions (false positive: pt does not have medication questions)    9:15am- TTN unable to reach pt. Left message for pt to callback [phone].       11:15am- Pt denies any emergent sx, including pain at this time. Pt was on phone w [facility] surgeon when TTN called earlier. Pt reported new diarrhea to surgeon. Per pt, surgeon advised her to monitor sx for now and to call him back tomorrow for an update. Pt has Tylenol and Norco available, but has not had to take. Pt has also been on Doxycycline for a while. TTN reviewed "Discharge Instructions" from AVS and informed pt how to reach [facility] surgeon on call for any questions or concerns after hours or weekend. Pt verbalizes understanding and states she is able to reach her surgeon anytime by text. Pt has post-op appts 5/29 and has transportation to appts. 911/ED precautions reviewed from AVS and discharge summary. Pt has no other questions or concerns at this time, verbalized understanding of what was reviewed, and is appreciative of follow-up call.              Marisa Cyphers. Genevive Bi, RN-BC  Transitional Telephonic Nursing  [place] [facility]  Office: [phone]  [email or web address]

## 2018-03-15 ENCOUNTER — Encounter (HOSPITAL_BASED_OUTPATIENT_CLINIC_OR_DEPARTMENT_OTHER): Payer: BLUE CROSS/BLUE SHIELD

## 2018-03-16 ENCOUNTER — Ambulatory Visit (INDEPENDENT_AMBULATORY_CARE_PROVIDER_SITE_OTHER): Payer: BLUE CROSS/BLUE SHIELD | Admitting: Otology & Neurotology

## 2018-03-16 ENCOUNTER — Ambulatory Visit (INDEPENDENT_AMBULATORY_CARE_PROVIDER_SITE_OTHER): Payer: BLUE CROSS/BLUE SHIELD | Admitting: Otolaryngology

## 2018-03-16 ENCOUNTER — Encounter (INDEPENDENT_AMBULATORY_CARE_PROVIDER_SITE_OTHER): Payer: BLUE CROSS/BLUE SHIELD | Admitting: Otology & Neurotology

## 2018-03-16 VITALS — BP 92/70 | HR 84 | Temp 97.2°F | Resp 18

## 2018-03-16 DIAGNOSIS — G960 Cerebrospinal fluid leak: Principal | ICD-10-CM

## 2018-03-16 DIAGNOSIS — G9601 Cranial cerebrospinal fluid leak, spontaneous: Secondary | ICD-10-CM

## 2018-03-16 NOTE — Progress Notes (Signed)
NEUROTOLOGY POST-OP FOLLOW-UP    History of present illness:  Natalie Rodriguez is a 37 year old female who underwent Left postauricular approach for Eustachian tube obliteration and abdominal fat graft.  No leak.        Allergies:  No Known Allergies    Review of Systems:   10 point review of systems conducted. Negative except as per HPI.    Physical Exam -   LMP 02/16/2018 (Exact Date)   General appearance: Patient appears well nourished, alert and oriented x 3. Calm.  Eyes:   Facial Nerve:  Neuro:     Ears:   No change  Craniotomy incision: dry.  Abdominal incision:      Imaging:none        Post-OP Audiogram:none       Pathology: none          Assessment & Plan:    37 year old female with csf leak repair.  Doing well.

## 2018-03-18 NOTE — Progress Notes (Signed)
[facility] Note    HISTORY: Natalie Rodriguez is a 37 year old female follows up for resection of eustachian tube and revision of left CSF leak from left eustachian tube with right nasoseptal flap. Current symptoms include nasal obstruction and facial pressure. Symptoms began after surgery. Symptom severity is severe. Improvement occurred with nothing. Additional evaluation has included prior endoscopy.    The patient filled out a follow-up form and this was reviewed with the patient and scanned into the chart.    Current Outpatient Medications   Medication Sig Dispense Refill    acetaminophen (TYLENOL) 325 MG tablet Take 2 tablets (650 mg) by mouth every 4 hours as needed for Mild Pain (Pain Score 1-3). 30 tablet 0    DOXYCYCLINE HYCLATE PO       HYDROcodone-acetaminophen (NORCO) 5-325 MG tablet Take 1 tablet by mouth every 6 hours as needed for Moderate Pain (Pain Score 4-6) or Severe Pain (Pain Score 7-10). 15 tablet 0     No current facility-administered medications for this visit.        PHYSICAL EXAM:   General Appearance: Pleasant, well-developed, well-nourished patient, in no apparent distress.   Head/Face: No skin lesions, face symmetric  Eyes: EOMI, PERRL  Ears: External ears normal to inspection and palpation, canals clear, TMs intact, no middle ear effusion.  Nose: Anterior rhinoscopy reveals straight septum, bilateral packs in place. Nasal endoscopy was indicated to better evaluate the nose and paranasal sinuses given the patients history and exam findings and is detailed below.  Oral Cavity/Pharynx: No masses or lesions of the lips, gums, tongue, floor of mouth, buccal mucosa, hard palate or soft palate. Dentition is healthy. No erythema, exudate, or tonsillar masses. Posteriorpharyngeal wall normal.  Neck/Lymphatic: no masses  Respiratory: breathing quietly, comfortably, no stridor or wheezing, equal chest excursion  Musculoskeletal: moves all extremities, normal  gait  Neurologic: Alert, CN 2-12 intact  Psychiatric: Mood and affect appropriate    PROCEDURE: Diagnostic Nasal Endoscopy  Anesthesia: Lidocaine 4% topical anesthetic was placed.  Description of procedure: A rigid endoscope was utilized. Bilateral packs removed. Crusts and necrotic material/tissue were debrided from the sinus cavities bilaterally with retained secretions removed. No adverse synechiae were noted. Flap viable and in place over left nasopharynx, s/p posterior septectomty.    IMPRESSION/PLAN: CSF leak s/p repair from above and below, doing well.

## 2018-03-21 ENCOUNTER — Telehealth (INDEPENDENT_AMBULATORY_CARE_PROVIDER_SITE_OTHER): Payer: Self-pay | Admitting: Otolaryngology

## 2018-03-21 NOTE — Telephone Encounter (Signed)
Received triage call on nursing line regarding pt c/o: possible csf leak    Pt s/p: posterilr septectomy, repair CSF fistula 03-09-18.    Pt states that she feels clear drainage coming from left nare after shower on Sat. 03-19-18. Continues to have intermittent drainage after showering.    Pt is requesting a call from Dr. Rodman Keyeconde,  Best number to reach pt is   ph: [phone].     Pt refuses to speak with on call resident.    [place].     Explained that I will give message to Dr. Rodman Keyeconde.  [email or web address] lvn [phone]

## 2018-03-22 ENCOUNTER — Telehealth (INDEPENDENT_AMBULATORY_CARE_PROVIDER_SITE_OTHER): Payer: Self-pay | Admitting: Otolaryngology

## 2018-03-22 NOTE — Telephone Encounter (Signed)
Pt is calling in regards to message below.   Pt requesting call back from Dr. Rodman Keyeconde.     Please advise

## 2018-03-22 NOTE — Telephone Encounter (Signed)
Dr. Rodman Keyeconde to call patient to discuss.

## 2018-04-15 ENCOUNTER — Telehealth (INDEPENDENT_AMBULATORY_CARE_PROVIDER_SITE_OTHER): Payer: Self-pay | Admitting: Otolaryngology

## 2018-04-15 NOTE — Telephone Encounter (Signed)
Forwarded to Dr. Rodman Keyeconde. Will reach out to patient.

## 2018-04-15 NOTE — Telephone Encounter (Signed)
Pt is calling to inform provider they're experiencing symptoms    Last Office Visit: 03/15/18    Next Office Visit: n/a     What are the symptoms: Pt is calling in regards to having clear watery fluid coming out of her nose whenever she happens to lean forward.       Pain level 0-10: 0    When did the symptoms start: today 04/15/2018    Where is the pain located:  n/a    Is there any bleeding: NO    Best Call Back #  [phone]    Best Call back time:  Anytime     Is it OK to leave a voicemail: yes     Was pt informed of turnaround time: YES

## 2018-04-15 NOTE — Telephone Encounter (Signed)
Reviewed message with Dr. Rodman Keyeconde.    Spoke to patient who states that she noticed some thin nasal drainage today from left nostril. Believes it might be related to weather, hot shower and exertional activity she has been doing at home. Noticed the nasal drainage when she bent down to tie shoe. States she tried to collect it but was unable to. Patient states she is not too concerned right now because she thinks it may be related to the hot weather at home currently, but states she will monitor symptoms. Instructed patient to try and collect nasal drainage if possible for testing. Informed patient to call in for update if symptoms do not improve or worsen, Dr. Rodman Keyeconde can coordinate care with continuing care providers as needed.

## 2018-04-30 ENCOUNTER — Emergency Department (INDEPENDENT_AMBULATORY_CARE_PROVIDER_SITE_OTHER)
Admission: EM | Admit: 2018-04-30 | Discharge: 2018-04-30 | Disposition: A | Discharge status: Home / Self Care | Payer: BLUE CROSS/BLUE SHIELD | Source: Home / Self Care

## 2018-04-30 ENCOUNTER — Encounter: Payer: Self-pay | Admitting: Emergency Medicine

## 2018-04-30 DIAGNOSIS — R0982 Postnasal drip: Secondary | ICD-10-CM | POA: Diagnosis not present

## 2018-04-30 DIAGNOSIS — J029 Acute pharyngitis, unspecified: Secondary | ICD-10-CM

## 2018-04-30 MED ORDER — BENZONATATE 100 MG PO CAPS: 100.0000 mg | ORAL_CAPSULE | Freq: Three times a day (TID) | ORAL | 0 refills | Status: DC | PRN

## 2018-04-30 MED ORDER — LEVOCETIRIZINE DIHYDROCHLORIDE 5 MG PO TABS: 5.0000 mg | ORAL_TABLET | Freq: Every evening | ORAL | 0 refills | Status: DC

## 2018-04-30 NOTE — ED Provider Notes (Signed)
Katelyn Willis CARE    CSN: [id number] Arrival date & time: 04/30/18  1402  History   Chief Complaint Chief Complaint  Patient presents with  . Sore Throat    HPI Katelyn Willis is a 37 y.o. female who complaining of sore throat, congestion, ear pain, and subjective temperature. Patient reports being prescribed multiple antibiotics over the course of this year for symptoms unrelated to today's visit.  She would like to avoid antibiotic therapy if possible.  She reports symptoms have been present ongoing for 3 weeks.  She reports that her husband had a virus which eventually resolved and she fears that she developed symptoms possibly from him.  She reports congestion and right ear pain which has since resolved, and some throat soreness which she is uncertain of whether or not his postnasal drainage.  She has not measured her temperature but feels she has been feverish.  She also reports experiencing "twinges in the abdomen". Denies associated nausea, vomiting, diarrhea, or constipation. She is currently on her menstrual cycle. Denies that the "twinges" are painful although unable to associate food or activities that may cause symptoms to occur. She has not attempted relief of abdominal "twinges" with any medications. Past Medical History:  Diagnosis Date  . Acoustic neuroma ([place])   . Acoustic neuroma ([place])   . Complication of anesthesia    unpleasant waking up, thrashing and crying  . CSF leak   . Hypothyroidism   . No pertinent past medical history     There are no active problems to display for this patient.   Past Surgical History:  Procedure Laterality Date  . BRAIN SURGERY     Benign Neuroma 05/18/2013  . CESAREAN SECTION  2007, 2005   x 2 attempt  . CRANIOTOMY     removal of most of acoustic neuroma  . LAPAROSCOPY  Nov 2011   x2  . LAPAROSCOPY    . OOPHORECTOMY  2009    OB History    Gravida  3   Para  2   Term  2   Preterm  0   AB  1   Living  2     SAB   0   TAB  0   Ectopic  0   Multiple  0   Live Births               Home Medications    Prior to Admission medications   Medication Sig Start Date End Date Taking? Authorizing Provider  acetaZOLAMIDE (DIAMOX) 500 MG capsule Take 500 mg by mouth 2 (two) times daily.    [provider]  NALTREXONE HCL PO Take 3 mg by mouth at bedtime.    [provider]    Family History History reviewed. No pertinent family history.  Social History Social History   Tobacco Use  . Smoking status: Never Smoker  . Smokeless tobacco: Never Used  Substance Use Topics  . Alcohol use: No  . Drug use: No     Allergies   Wheat bran and Gluten meal   Review of Systems Review of Systems Pertinent negatives listed in HPI Physical Exam Triage Vital Signs ED Triage Vitals  Enc Vitals Group     BP 04/30/18 1430 100/66     Pulse Rate 04/30/18 1430 72     Resp 04/30/18 1430 16     Temp 04/30/18 1430 98.4 F (36.9 C)     Temp Source 04/30/18 1430 Oral  SpO2 04/30/18 1430 100 %     Weight 04/30/18 1431 111 lb 12 oz (50.7 kg)     Height 04/30/18 1431 5\' 6"  (1.676 m)     Head Circumference --      Peak Flow --      Pain Score 04/30/18 1431 2     Pain Loc --      Pain Edu? --      Excl. in [place]? --    No data found.  Updated Vital Signs BP 100/66 (BP Location: Left Arm)   Pulse 72   Temp 98.4 F (36.9 C) (Oral)   Resp 16   Ht 5\' 6"  (1.676 m)   Wt 111 lb 12 oz (50.7 kg)   LMP 04/30/2018   SpO2 100%   BMI 18.04 kg/m   Visual Acuity Right Eye Distance:   Left Eye Distance:   Bilateral Distance:    Right Eye Near:   Left Eye Near:    Bilateral Near:     Physical Exam She appears well, vital signs are as noted. Ears normal.  Throat and pharynx , clear, non-reddened. Neck supple. No adenopathy in the neck. Nose is congested. Mild edema bilaterally. Unable to complete evaluation left wear canal as it is occluded secondary to prior surgery. Sinuses non  tender. Heart rhythm and rate normal. Heart sounds noted within normal parameters. The lung were examined and are clear, without wheezes or rales. Abdomen is non-distended, non-tender, bowel sounds are normal, and no palpable tenderness noted.  She is alert and oriented. UC Treatments / Results  Labs (all labs ordered are listed, but only abnormal results are displayed) Labs Reviewed - No data to display  EKG None  Radiology No results found.  Procedures Procedures (including critical care time)  Medications Ordered in UC Medications - No data to display  Initial Impression / Assessment and Plan / UC Course  I have reviewed the triage vital signs and the nursing notes.  Pertinent labs & imaging results that were available during my care of the patient were reviewed by me and considered in my medical decision making (see chart for details).  Patient presents today with symptoms consistent with URI.  Symptoms have been ongoing for approximately 3 weeks. She would like to avoid antibiotic therapy as she has been prescribed several antibiotics over the course of this year.  Will treat symptomatically for now with Xyzal 5 mg at bedtime for sinus inflammation and postnasal drainage.  She was also advised that sore throat is likely related to postnasal drainage as throat exam was completely normal. Recommend that she takes medication consistently for 2 weeks to reduce sinus inflammation and relieve postnasal drainage thereby reducing the risk of rebound symptoms if medication is abruptly stopped.  Also recommended benzonatate for cough as needed up to 3 times daily as prescribed.  Also recommended throat lozenges which can be used to manage cough.  Advised patient to continue to monitor the twinges that she is experiencing in her abdomen if they worsen or if they become painful recommend immediate follow-up and further evaluation of underlying cause of symptoms.  Abdominal exam was completely  unremarkable today.  Patient verbalized understanding and will follow-up if symptoms worsen.  Final Clinical Impressions(s) / UC Diagnoses   Final diagnoses:  Post-nasal drip  Sore throat      Discharge Instructions     I suspect her symptoms today are likely related to postnasal drainage from sinus inflammation.  I am prescribing  Xyzal 5 mg to take daily at bedtime at least for the next 2 weeks to treat sinus inflammation thereby relieving you have postnasal drainage.  I have also prescribed benzonatate 100 to 200 mg up to 3 times daily as needed for cough.  I also recommend utilizing cough suppressant lozenges as needed for cough suppressant as well.    ED Prescriptions    Medication Sig Dispense Auth. Provider   levocetirizine (XYZAL) 5 MG tablet Take 1 tablet (5 mg total) by mouth every evening. 30 tablet Scot Jun, FNP   benzonatate (TESSALON) 100 MG capsule Take 1-2 capsules (100-200 mg total) by mouth 3 (three) times daily as needed for cough. 40 capsule Scot Jun, FNP     Controlled Substance Prescriptions  [place] Controlled Substance Registry consulted? No   Scot Jun, [place] 05/02/18 1554

## 2018-04-30 NOTE — Discharge Instructions (Signed)
I suspect her symptoms today are likely related to postnasal drainage from sinus inflammation.  I am prescribing Xyzal 5 mg to take daily at bedtime at least for the next 2 weeks to treat sinus inflammation thereby relieving you have postnasal drainage.  I have also prescribed benzonatate 100 to 200 mg up to 3 times daily as needed for cough.  I also recommend utilizing cough suppressant lozenges as needed for cough suppressant as well.

## 2018-04-30 NOTE — ED Triage Notes (Signed)
Patient presents to [facility] with C/O sore throat times 3 weeks. It seems to improve and then get worse again no fever also C/O right ear pain. Rates pain 2/10

## 2018-05-27 ENCOUNTER — Other Ambulatory Visit: Payer: Self-pay | Admitting: Family Medicine

## 2019-12-18 ENCOUNTER — Emergency Department (INDEPENDENT_AMBULATORY_CARE_PROVIDER_SITE_OTHER): Payer: BC Managed Care – PPO

## 2019-12-18 ENCOUNTER — Emergency Department (INDEPENDENT_AMBULATORY_CARE_PROVIDER_SITE_OTHER)
Admission: EM | Admit: 2019-12-18 | Discharge: 2019-12-18 | Disposition: A | Discharge status: Home / Self Care | Payer: BC Managed Care – PPO | Source: Home / Self Care

## 2019-12-18 ENCOUNTER — Other Ambulatory Visit: Payer: Self-pay

## 2019-12-18 DIAGNOSIS — X58XXXA Exposure to other specified factors, initial encounter: Secondary | ICD-10-CM

## 2019-12-18 DIAGNOSIS — S92502A Displaced unspecified fracture of left lesser toe(s), initial encounter for closed fracture: Secondary | ICD-10-CM | POA: Diagnosis not present

## 2019-12-18 DIAGNOSIS — S92515A Nondisplaced fracture of proximal phalanx of left lesser toe(s), initial encounter for closed fracture: Secondary | ICD-10-CM | POA: Diagnosis not present

## 2019-12-18 NOTE — ED Triage Notes (Signed)
Pt presents with c/o left foot pain secondary to injury 2 weeks ago. She is experiencing 3/10 pain and has not taken any otc medications for pain. She describes the injury as "hitting my foot at the end of the bed". No other complaints at this time.

## 2019-12-18 NOTE — ED Provider Notes (Signed)
Katelyn Willis CARE    CSN: [id number] Arrival date & time: 12/18/19  0948      History   Chief Complaint Chief Complaint  Patient presents with  . Foot Pain    left swelling    HPI Katelyn Willis is a 39 y.o. female.   HPI  Katelyn Willis is a 39 y.o. female presenting to UC with c/o 2 weeks of persistent Left foot pain at the base of her 3rd, 4th, and 5th toes.  Pain started after she hit her food on her wood bed frame.  Pain is 3/10, she is able to manage the pain without medication but has become concerned with the duration of the pain. She also reports feeling some "crunching" at times in her foot.  Denies wounds or bruising to her foot or toes. No prior fracture or surgery to Left foot.     Past Medical History:  Diagnosis Date  . Acoustic neuroma ([place])   . Acoustic neuroma ([place])   . Complication of anesthesia    unpleasant waking up, thrashing and crying  . CSF leak   . Hypothyroidism   . No pertinent past medical history     There are no problems to display for this patient.   Past Surgical History:  Procedure Laterality Date  . BRAIN SURGERY     Benign Neuroma 05/18/2013  . CESAREAN SECTION  2007, 2005   x 2 attempt  . CRANIOTOMY     removal of most of acoustic neuroma  . LAPAROSCOPY  Nov 2011   x2  . LAPAROSCOPY    . OOPHORECTOMY  2009    OB History    Gravida  3   Para  2   Term  2   Preterm  0   AB  1   Living  2     SAB  0   TAB  0   Ectopic  0   Multiple  0   Live Births               Home Medications    Prior to Admission medications   Not on File    Family History History reviewed. No pertinent family history.  Social History Social History   Tobacco Use  . Smoking status: Never Smoker  . Smokeless tobacco: Never Used  Substance Use Topics  . Alcohol use: No  . Drug use: No     Allergies   Other, Wheat bran, and Gluten meal   Review of Systems Review of Systems  Musculoskeletal: Positive for  arthralgias. Negative for joint swelling.  Skin: Negative for color change and wound.     Physical Exam Triage Vital Signs ED Triage Vitals  Enc Vitals Group     BP 12/18/19 1026 104/71     Pulse Rate 12/18/19 1026 84     Resp 12/18/19 1026 16     Temp 12/18/19 1026 98.3 F (36.8 C)     Temp Source 12/18/19 1026 Oral     SpO2 12/18/19 1026 100 %     Weight 12/18/19 1027 119 lb (54 kg)     Height 12/18/19 1027 5\' 6"  (1.676 m)     Head Circumference --      Peak Flow --      Pain Score 12/18/19 1026 3     Pain Loc --      Pain Edu? --      Excl. in [place]? --    No data found.  Updated Vital Signs BP 104/71 (BP Location: Right Arm)   Pulse 84   Temp 98.3 F (36.8 C) (Oral)   Resp 16   Ht 5\' 6"  (1.676 m)   Wt 119 lb (54 kg)   LMP 12/17/2019 (Exact Date)   SpO2 100%   BMI 19.21 kg/m   Visual Acuity Right Eye Distance:   Left Eye Distance:   Bilateral Distance:    Right Eye Near:   Left Eye Near:    Bilateral Near:     Physical Exam Vitals and nursing note reviewed.  Constitutional:      Appearance: Normal appearance. She is well-developed.  HENT:     Head: Normocephalic and atraumatic.  Cardiovascular:     Rate and Rhythm: Normal rate and regular rhythm.     Pulses:          Dorsalis pedis pulses are 2+ on the left side.       Posterior tibial pulses are 2+ on the left side.  Pulmonary:     Effort: Pulmonary effort is normal.  Musculoskeletal:        General: Tenderness present.     Cervical back: Normal range of motion.     Left foot: Decreased range of motion. Normal capillary refill. Tenderness and bony tenderness present. No swelling, deformity or crepitus. Normal pulse.       Legs:  Skin:    General: Skin is warm and dry.     Capillary Refill: Capillary refill takes less than 2 seconds.     Findings: No bruising or erythema.  Neurological:     General: No focal deficit present.     Mental Status: She is alert and oriented to person, place, and  time.  Psychiatric:        Behavior: Behavior normal.      UC Treatments / Results  Labs (all labs ordered are listed, but only abnormal results are displayed) Labs Reviewed - No data to display  EKG   Radiology DG Foot Complete Left  Result Date: 12/18/2019 CLINICAL DATA:  Trauma several weeks ago. Complains of left dorsal and lateral foot pain. Base of toe tenderness EXAM: LEFT FOOT - COMPLETE 3+ VIEW COMPARISON:  None. FINDINGS: There is a nondisplaced fracture deformity involving the proximal shaft of the fourth proximal phalanx. No additional fractures or dislocations. IMPRESSION: Nondisplaced fracture involves the proximal shaft of the fourth proximal phalanx. Electronically Signed   By: Kerby Moors M.D.   On: 12/18/2019 10:46    Procedures Procedures (including critical care time)  Medications Ordered in UC Medications - No data to display  Initial Impression / Assessment and Plan / UC Course  I have reviewed the triage vital signs and the nursing notes.  Pertinent labs & imaging results that were available during my care of the patient were reviewed by me and considered in my medical decision making (see chart for details).     Discussed imaging with pt Nursing staff strapped 3rd and 4th toes using buddy taping technique. Ortho shoe provided for comfort AVS provided  Final Clinical Impressions(s) / UC Diagnoses   Final diagnoses:  Closed fracture of phalanx of left fourth toe, initial encounter     Discharge Instructions      You may wear the shoe provided today for comfort and to help in healing. You may take Tylenol and Motrin for pain. Call to schedule an appointment with Sports Medicine in 1-2 weeks if not improving, sooner if worsening.  ED Prescriptions    None     PDMP not reviewed this encounter.   Noe Gens, [place] 12/18/19 1359

## 2019-12-18 NOTE — Discharge Instructions (Signed)
  You may wear the shoe provided today for comfort and to help in healing. You may take Tylenol and Motrin for pain. Call to schedule an appointment with Sports Medicine in 1-2 weeks if not improving, sooner if worsening.

## 2020-07-04 ENCOUNTER — Telehealth (INDEPENDENT_AMBULATORY_CARE_PROVIDER_SITE_OTHER): Payer: Self-pay | Admitting: Otology & Neurotology

## 2020-07-04 NOTE — Telephone Encounter (Signed)
Spoke to patient to explain reports arrived and wanted to follow up in regards to imaging. Patient will confirm [facility] is mailing over CD/images and requested for images to be reviewed by Dr. Zachery Conch.    Will forward to team once images comes in.

## 2020-09-27 IMAGING — DX DG FOOT COMPLETE 3+V*L*
3 series · 3 of 3 positions shown · non-contrast
Comparison: None.

CLINICAL DATA: Trauma several weeks ago. Complains of left dorsal
and lateral foot pain. Base of toe tenderness

EXAM:
LEFT FOOT - COMPLETE 3+ VIEW

[foot ap]
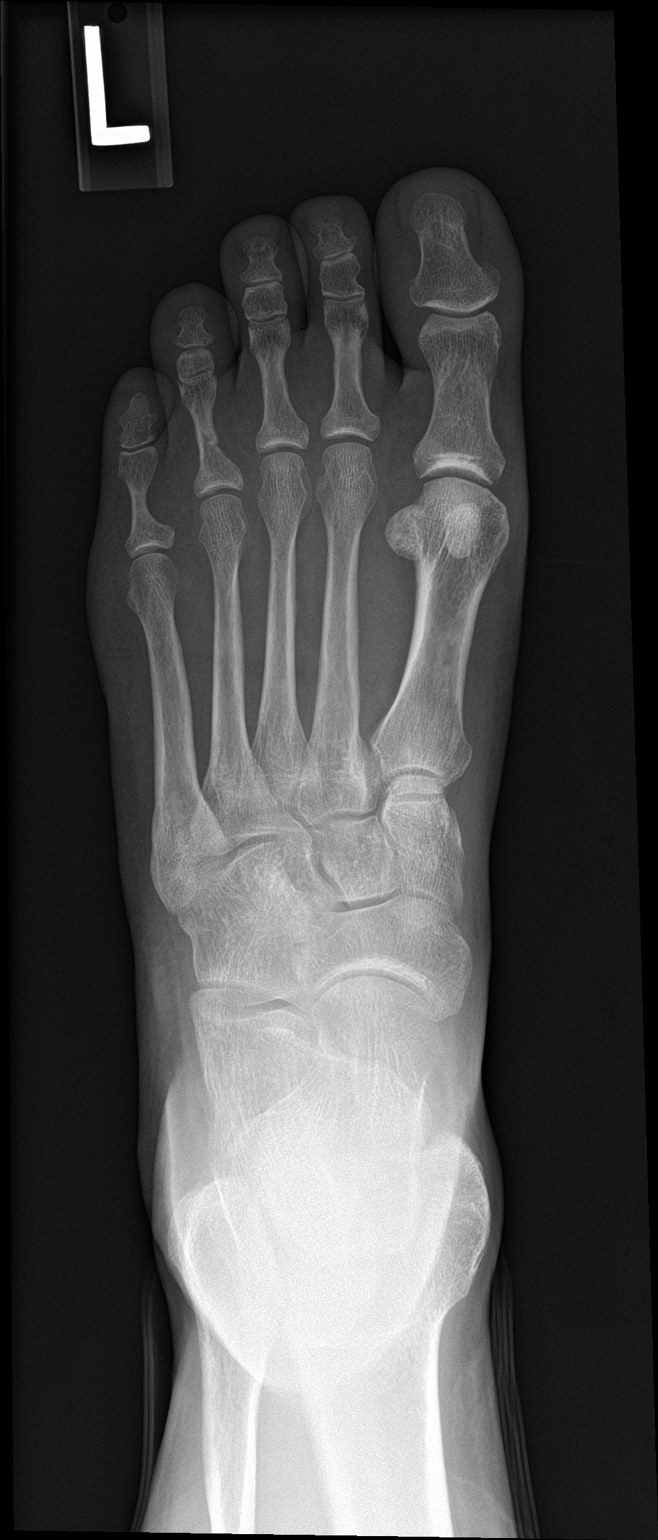

[foot obl]
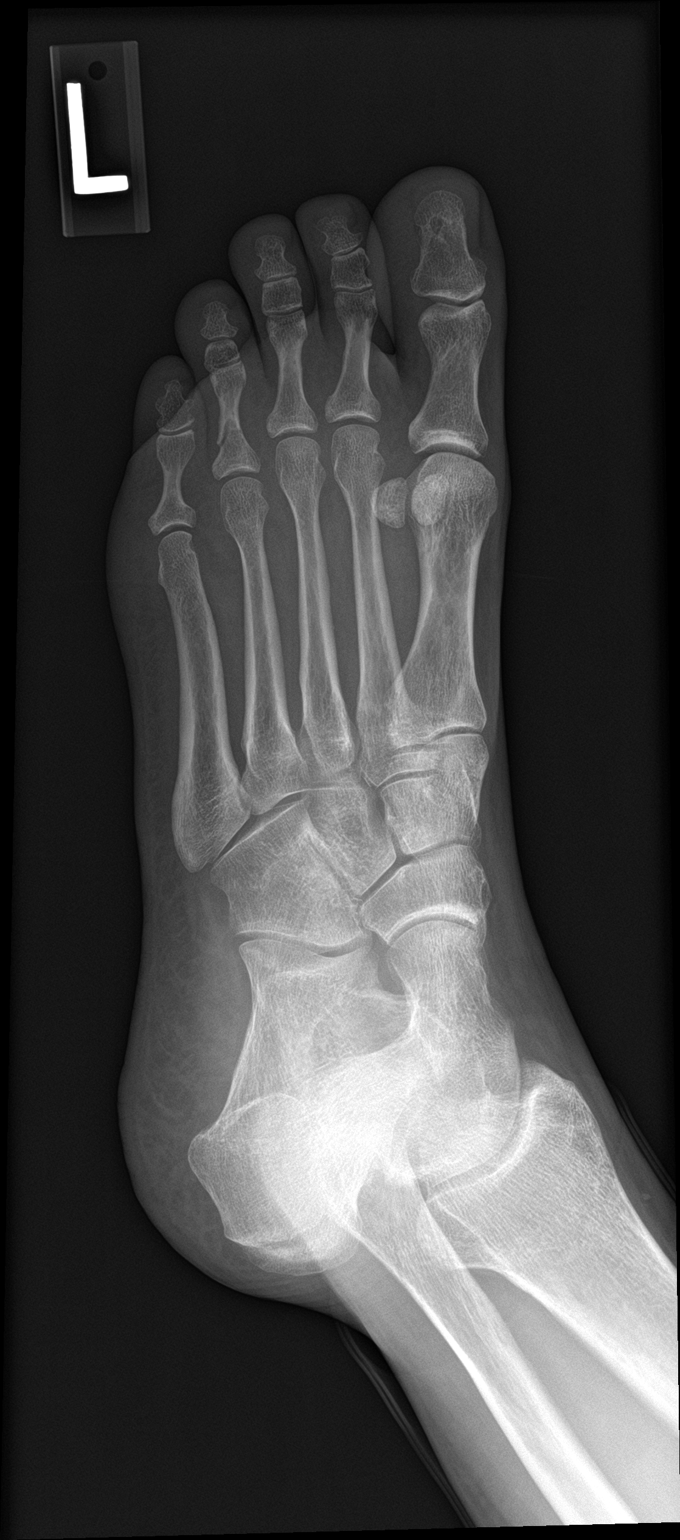

[foot lat]
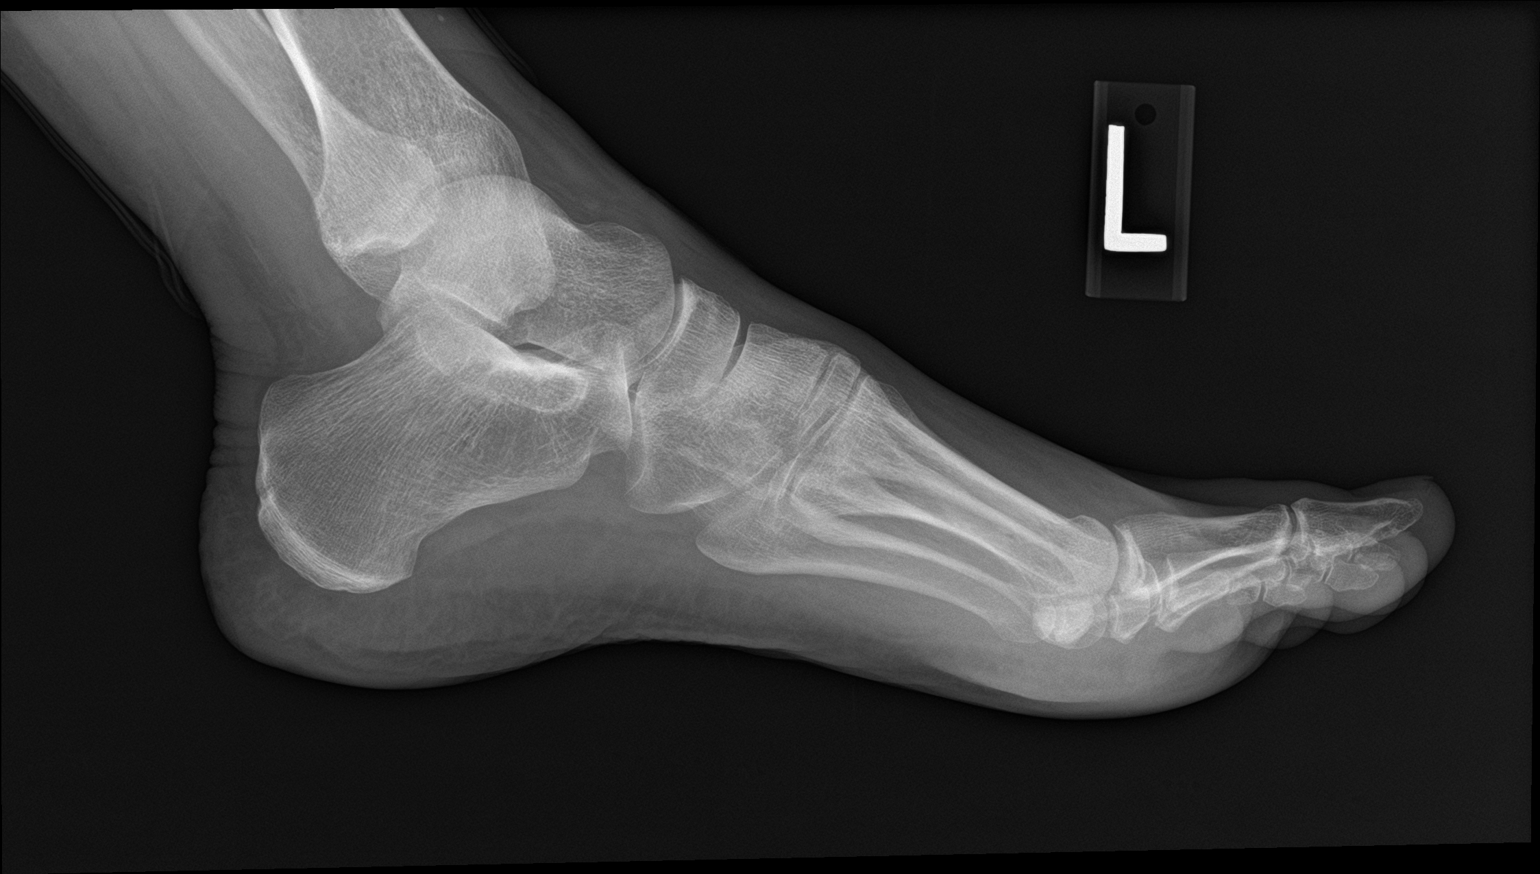

[3 of 3 positions shown; findings below may reference images not displayed]

FINDINGS: There is a nondisplaced fracture deformity involving the proximal
shaft of the fourth proximal phalanx. No additional fractures or
dislocations.
IMPRESSION: Nondisplaced fracture involves the proximal shaft of the fourth
proximal phalanx.

## 2021-07-21 ENCOUNTER — Encounter: Payer: Self-pay | Admitting: Emergency Medicine

## 2021-07-21 ENCOUNTER — Emergency Department (INDEPENDENT_AMBULATORY_CARE_PROVIDER_SITE_OTHER)
Admission: EM | Admit: 2021-07-21 | Discharge: 2021-07-21 | Disposition: A | Discharge status: Home / Self Care | Payer: BC Managed Care – PPO | Source: Home / Self Care

## 2021-07-21 ENCOUNTER — Other Ambulatory Visit: Payer: Self-pay

## 2021-07-21 ENCOUNTER — Emergency Department (INDEPENDENT_AMBULATORY_CARE_PROVIDER_SITE_OTHER): Payer: BC Managed Care – PPO

## 2021-07-21 DIAGNOSIS — M79641 Pain in right hand: Secondary | ICD-10-CM

## 2021-07-21 NOTE — ED Provider Notes (Signed)
[name] CARE    CSN: [id number] Arrival date & time: 07/21/21  1825      History   Chief Complaint Chief Complaint  Patient presents with   Fall    HPI Katelyn Willis is a 40 y.o. female.   HPI 40 year old female presents with right hand pain secondary to fall 10 days ago.  Past Medical History:  Diagnosis Date   Acoustic neuroma ([place])    Acoustic neuroma ([facility])    Complication of anesthesia    unpleasant waking up, thrashing and crying   CSF leak    Hypothyroidism    No pertinent past medical history     There are no problems to display for this patient.   Past Surgical History:  Procedure Laterality Date   BRAIN SURGERY     Benign Neuroma 05/18/2013   CESAREAN SECTION  2007, 2005   x 2 attempt   CRANIOTOMY     removal of most of acoustic neuroma   LAPAROSCOPY  Nov 2011   x2   LAPAROSCOPY     OOPHORECTOMY  2009    OB History     Gravida  3   Para  2   Term  2   Preterm  0   AB  1   Living  2      SAB  0   IAB  0   Ectopic  0   Multiple  0   Live Births               Home Medications    Prior to Admission medications   Not on File    Family History History reviewed. No pertinent family history.  Social History Social History   Tobacco Use   Smoking status: Never   Smokeless tobacco: Never  Substance Use Topics   Alcohol use: No   Drug use: No     Allergies   Other, Wheat bran, and Gluten meal   Review of Systems Review of Systems  Musculoskeletal:        Hand pain x10 days secondary to fall  All other systems reviewed and are negative.   Physical Exam Triage Vital Signs ED Triage Vitals  Enc Vitals Group     BP 07/21/21 1909 (!) 102/58     Pulse Rate 07/21/21 1909 67     Resp 07/21/21 1909 18     Temp 07/21/21 1909 98.3 F (36.8 C)     Temp Source 07/21/21 1909 Oral     SpO2 07/21/21 1909 99 %     Weight 07/21/21 1910 125 lb (56.7 kg)     Height 07/21/21 1910 5\' 6"  (1.676 m)     Head  Circumference --      Peak Flow --      Pain Score 07/21/21 1910 3     Pain Loc --      Pain Edu? --      Excl. in [place]? --    No data found.  Updated Vital Signs BP (!) 102/58 (BP Location: Left Arm)   Pulse 67   Temp 98.3 F (36.8 C) (Oral)   Resp 18   Ht 5\' 6"  (1.676 m)   Wt 125 lb (56.7 kg)   SpO2 99%   BMI 20.18 kg/m    Physical Exam Vitals and nursing note reviewed.  Constitutional:      General: She is not in acute distress.    Appearance: Normal appearance. She is normal weight. She  is not ill-appearing.  HENT:     Head: Normocephalic and atraumatic.     Mouth/Throat:     Mouth: Mucous membranes are moist.     Pharynx: Oropharynx is clear.  Eyes:     Extraocular Movements: Extraocular movements intact.     Conjunctiva/sclera: Conjunctivae normal.     Pupils: Pupils are equal, round, and reactive to light.  Cardiovascular:     Rate and Rhythm: Normal rate and regular rhythm.     Pulses: Normal pulses.     Heart sounds: Normal heart sounds.  Pulmonary:     Effort: Pulmonary effort is normal.     Breath sounds: Normal breath sounds.  Musculoskeletal:        General: Normal range of motion.     Cervical back: Normal range of motion and neck supple.     Comments: Right hand (dorsum): TTP over capitate, scaphoid, and trapezoid, deformity noted  Skin:    General: Skin is warm and dry.  Neurological:     General: No focal deficit present.     Mental Status: She is alert and oriented to person, place, and time. Mental status is at baseline.  Psychiatric:        Mood and Affect: Mood normal.        Behavior: Behavior normal.     UC Treatments / Results  Labs (all labs ordered are listed, but only abnormal results are displayed) Labs Reviewed - No data to display  EKG   Radiology DG Hand Complete Right  Result Date: 07/21/2021 CLINICAL DATA:  Status post trauma with subsequent fifth right finger pain EXAM: RIGHT HAND - COMPLETE 3+ VIEW COMPARISON:   None. FINDINGS: There is limited evaluation of the proximal phalanx of the fourth right finger secondary to overlying jewelry. There is no evidence of acute fracture or dislocation. A chronic versus congenital deformity is seen involving the middle phalanx of the fifth right finger. There is no evidence of arthropathy or other focal bone abnormality. Soft tissues are unremarkable. IMPRESSION: 1. No acute findings. 2. 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 Electronically Signed   By: Virgina Norfolk M.D.   On: 07/21/2021 19:42    Procedures Procedures (including critical care time)  Medications Ordered in UC Medications - No data to display  Initial Impression / Assessment and Plan / UC Course  I have reviewed the triage vital signs and the nursing notes.  Pertinent labs & imaging results that were available during my care of the patient were reviewed by me and considered in my medical decision making (see chart for details).     MDM: 1.  Right hand pain-Advised patient may take 400 mg OTC Ibuprofen 1-2 times daily, as needed for hand pain.  Advised patient if right hand pain worsens he is to follow-up with PCP or orthopedics for further evaluation.  Patient discharged home, hemodynamically stable. Final Clinical Impressions(s) / UC Diagnoses   Final diagnoses:  Right hand pain     Discharge Instructions      Advised patient may take 400 mg OTC Ibuprofen 1-2 times daily, as needed for hand pain.  Advised patient if right hand pain worsens he is to follow-up with PCP or orthopedics for further evaluation.     ED Prescriptions   None    PDMP not reviewed this encounter.   Eliezer Lofts, [place] 07/21/21 [phone]

## 2021-07-21 NOTE — Discharge Instructions (Addendum)
Advised patient may take 400 mg OTC Ibuprofen 1-2 times daily, as needed for hand pain.  Advised patient if right hand pain worsens he is to follow-up with PCP or orthopedics for further evaluation.

## 2021-07-21 NOTE — ED Triage Notes (Signed)
Rt hand injury from fall 2 days ago.

## 2022-01-05 ENCOUNTER — Emergency Department (INDEPENDENT_AMBULATORY_CARE_PROVIDER_SITE_OTHER)
Admission: EM | Admit: 2022-01-05 | Discharge: 2022-01-05 | Disposition: A | Discharge status: Home / Self Care | Payer: Self-pay | Source: Home / Self Care

## 2022-01-05 ENCOUNTER — Other Ambulatory Visit: Payer: Self-pay

## 2022-01-05 DIAGNOSIS — S61012A Laceration without foreign body of left thumb without damage to nail, initial encounter: Secondary | ICD-10-CM

## 2022-01-05 MED ORDER — CEPHALEXIN 500 MG PO CAPS: 500.0000 mg | ORAL_CAPSULE | Freq: Every day | ORAL | 0 refills | Status: AC

## 2022-01-05 NOTE — ED Triage Notes (Signed)
Pt states that she has a laceration to her left thumb. X1 day 

## 2022-01-05 NOTE — ED Provider Notes (Signed)
?[facility] ? ? ? ?CSN: [id number] ?Arrival date & time: 01/05/22  1108 ? ? ?  ? ?History   ?Chief Complaint ?Chief Complaint  ?Patient presents with  ? Laceration  ?  Left thumb laceration. X1 day  ? ? ?HPI ?Katelyn Willis is a 41 y.o. female.  ? ?HPI ?41 year old female presents with small laceration of left thumb that occurred earlier today while cutting fruit (apples) for children's lunch.  PMH significant for acoustic neuroma and hypothyroidism. ?Past Medical History:  ?Diagnosis Date  ? Acoustic neuroma ([place])   ? Acoustic neuroma ([place])   ? Complication of anesthesia   ? unpleasant waking up, thrashing and crying  ? CSF leak   ? Hypothyroidism   ? No pertinent past medical history   ? ? ?There are no problems to display for this patient. ? ? ?Past Surgical History:  ?Procedure Laterality Date  ? BRAIN SURGERY    ? Benign Neuroma 05/18/2013  ? CESAREAN SECTION  2007, 2005  ? x 2 attempt  ? CRANIOTOMY    ? removal of most of acoustic neuroma  ? LAPAROSCOPY  Nov 2011  ? x2  ? LAPAROSCOPY    ? OOPHORECTOMY  2009  ? ? ?OB History   ? ? Gravida  ?3  ? Para  ?2  ? Term  ?2  ? Preterm  ?0  ? AB  ?1  ? Living  ?2  ?  ? ? SAB  ?0  ? IAB  ?0  ? Ectopic  ?0  ? Multiple  ?0  ? Live Births  ?   ?   ?  ?  ? ? ? ?Home Medications   ? ?Prior to Admission medications   ?Medication Sig Start Date End Date Taking? Authorizing Provider  ?cephALEXin (KEFLEX) 500 MG capsule Take 1 capsule (500 mg total) by mouth daily for 7 days. 01/05/22 01/12/22 Yes Eliezer Lofts, FNP  ? ? ?Family History ?History reviewed. No pertinent family history. ? ?Social History ?Social History  ? ?Tobacco Use  ? Smoking status: Never  ? Smokeless tobacco: Never  ?Substance Use Topics  ? Alcohol use: No  ? Drug use: No  ? ? ? ?Allergies   ?Other, Wheat bran, and Gluten meal ? ? ?Review of Systems ?Review of Systems  ?Skin:  Positive for wound.  ?All other systems reviewed and are negative. ? ? ?Physical Exam ?Triage Vital Signs ?ED Triage Vitals   ?Enc Vitals Group  ?   BP 01/05/22 1133 101/68  ?   Pulse Rate 01/05/22 1133 85  ?   Resp 01/05/22 1133 20  ?   Temp 01/05/22 1133 98.3 ?F (36.8 ?C)  ?   Temp Source 01/05/22 1133 Oral  ?   SpO2 01/05/22 1133 98 %  ?   Weight 01/05/22 1131 125 lb (56.7 kg)  ?   Height 01/05/22 1131 '5\' 6"'$  (1.676 m)  ?   Head Circumference --   ?   Peak Flow --   ?   Pain Score 01/05/22 1131 1  ?   Pain Loc --   ?   Pain Edu? --   ?   Excl. in [place]? --   ? ?No data found. ? ?Updated Vital Signs ?BP 101/68 (BP Location: Right Arm)   Pulse 85   Temp 98.3 ?F (36.8 ?C) (Oral)   Resp 20   Ht '5\' 6"'$  (1.676 m)   Wt 125 lb (56.7 kg)   LMP 12/15/2021  SpO2 98%   BMI 20.18 kg/m?  ? ? ?Physical Exam ?Vitals and nursing note reviewed.  ?Constitutional:   ?   Appearance: Normal appearance. She is normal weight.  ?HENT:  ?   Head: Normocephalic and atraumatic.  ?   Mouth/Throat:  ?   Mouth: Mucous membranes are moist.  ?   Pharynx: Oropharynx is clear.  ?Eyes:  ?   Extraocular Movements: Extraocular movements intact.  ?   Conjunctiva/sclera: Conjunctivae normal.  ?   Pupils: Pupils are equal, round, and reactive to light.  ?Cardiovascular:  ?   Rate and Rhythm: Normal rate and regular rhythm.  ?   Pulses: Normal pulses.  ?   Heart sounds: Normal heart sounds. No murmur heard. ?Pulmonary:  ?   Effort: Pulmonary effort is normal.  ?   Breath sounds: Normal breath sounds. No wheezing, rhonchi or rales.  ?Musculoskeletal:  ?   Cervical back: Normal range of motion and neck supple.  ?Skin: ?   General: Skin is warm and dry.  ?   Comments: Left thumb (volar aspect to tip): tiny (0.5 cm x 1 mm) linear laceration noted, nonindurated, nonfluctuant, homeostasis achieved by direct pressure.  After 15 minutes of soaking in Hibiclens affected area was dried thoroughly and inspected-no foreign bodies identified.  Benzoin tincture placed to anchor 2-3 mm x 35 mm Steri-Strips followed by Dermabond.  Patient tolerated procedure well, without complication.   ?Neurological:  ?   General: No focal deficit present.  ?   Mental Status: She is alert and oriented to person, place, and time. Mental status is at baseline.  ? ? ? ?UC Treatments / Results  ?Labs ?(all labs ordered are listed, but only abnormal results are displayed) ?Labs Reviewed - No data to display ? ?EKG ? ? ?Radiology ?No results found. ? ?Procedures ?Procedures (including critical care time) ? ?Medications Ordered in UC ?Medications - No data to display ? ?Initial Impression / Assessment and Plan / UC Course  ?I have reviewed the triage vital signs and the nursing notes. ? ?Pertinent labs & imaging results that were available during my care of the patient were reviewed by me and considered in my medical decision making (see chart for details). ? ?  ? ?MDM: 1. Laceration of left thumb without foreign body without damage to nail, initial encounter-repaired with Steri-Strips and Dermabond without complication.  Patient declined Tdap today. Advised patient to keep affected area of left thumb dry and clean for the next 24 to 36 hours, afterwards leave open to air to allow to heal by secondary intention.  Advised patient to take medication as directed with food to completion.  Encouraged patient to increase daily water intake while taking this medication.  Advised if symptoms worsen and/or unresolved please follow-up with PCP or here for further evaluation.  Patient discharged home, hemodynamically stable. ?Final Clinical Impressions(s) / UC Diagnoses  ? ?Final diagnoses:  ?Laceration of left thumb without foreign body without damage to nail, initial encounter  ? ? ? ?Discharge Instructions   ? ?  ?Advised patient to keep affected area of left thumb dry and clean for the next 24 to 36 hours, afterwards leave open to air to allow to heal by secondary intention.  Advised patient to take medication as directed with food to completion.  Encourage patient increase daily water intake while taking this medication.   Advised if symptoms worsen and/or unresolved please follow-up with PCP or here for further evaluation. ? ? ? ? ?  ED Prescriptions   ? ? Medication Sig Dispense Auth. Provider  ? cephALEXin (KEFLEX) 500 MG capsule Take 1 capsule (500 mg total) by mouth daily for 7 days. 7 capsule Eliezer Lofts, FNP  ? ?  ? ?PDMP not reviewed this encounter. ?  ?Eliezer Lofts, [place] ?01/05/22 1251 ? ?

## 2022-01-05 NOTE — Discharge Instructions (Addendum)
Advised patient to keep affected area of left thumb dry and clean for the next 24 to 36 hours, afterwards leave open to air to allow to heal by secondary intention.  Advised patient to take medication as directed with food to completion.  Encourage patient increase daily water intake while taking this medication.  Advised if symptoms worsen and/or unresolved please follow-up with PCP or here for further evaluation. ?

## 2022-05-01 IMAGING — DX DG HAND COMPLETE 3+V*R*
3 series · 3 of 3 positions shown · non-contrast
Comparison: None.

CLINICAL DATA: Status post trauma with subsequent fifth right
finger pain

EXAM:
RIGHT HAND - COMPLETE 3+ VIEW

[hand pa]
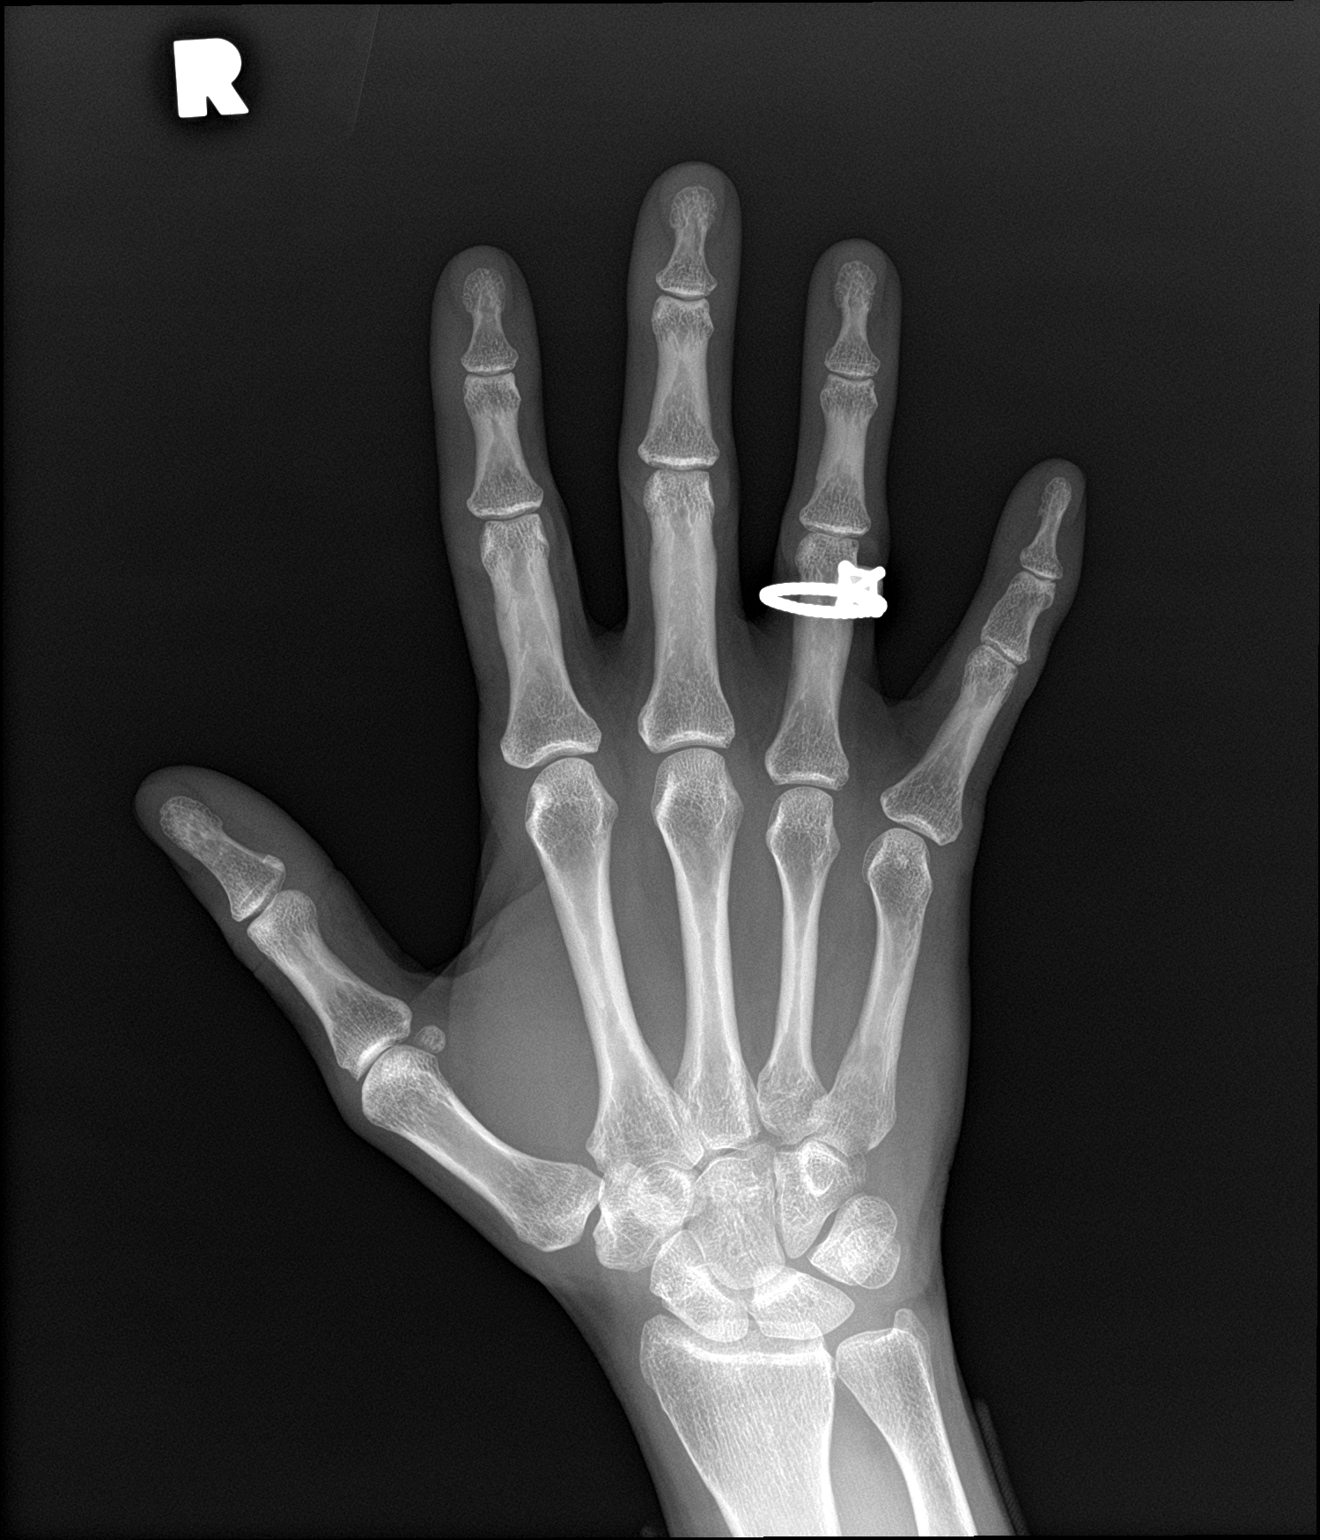

[hand obl]
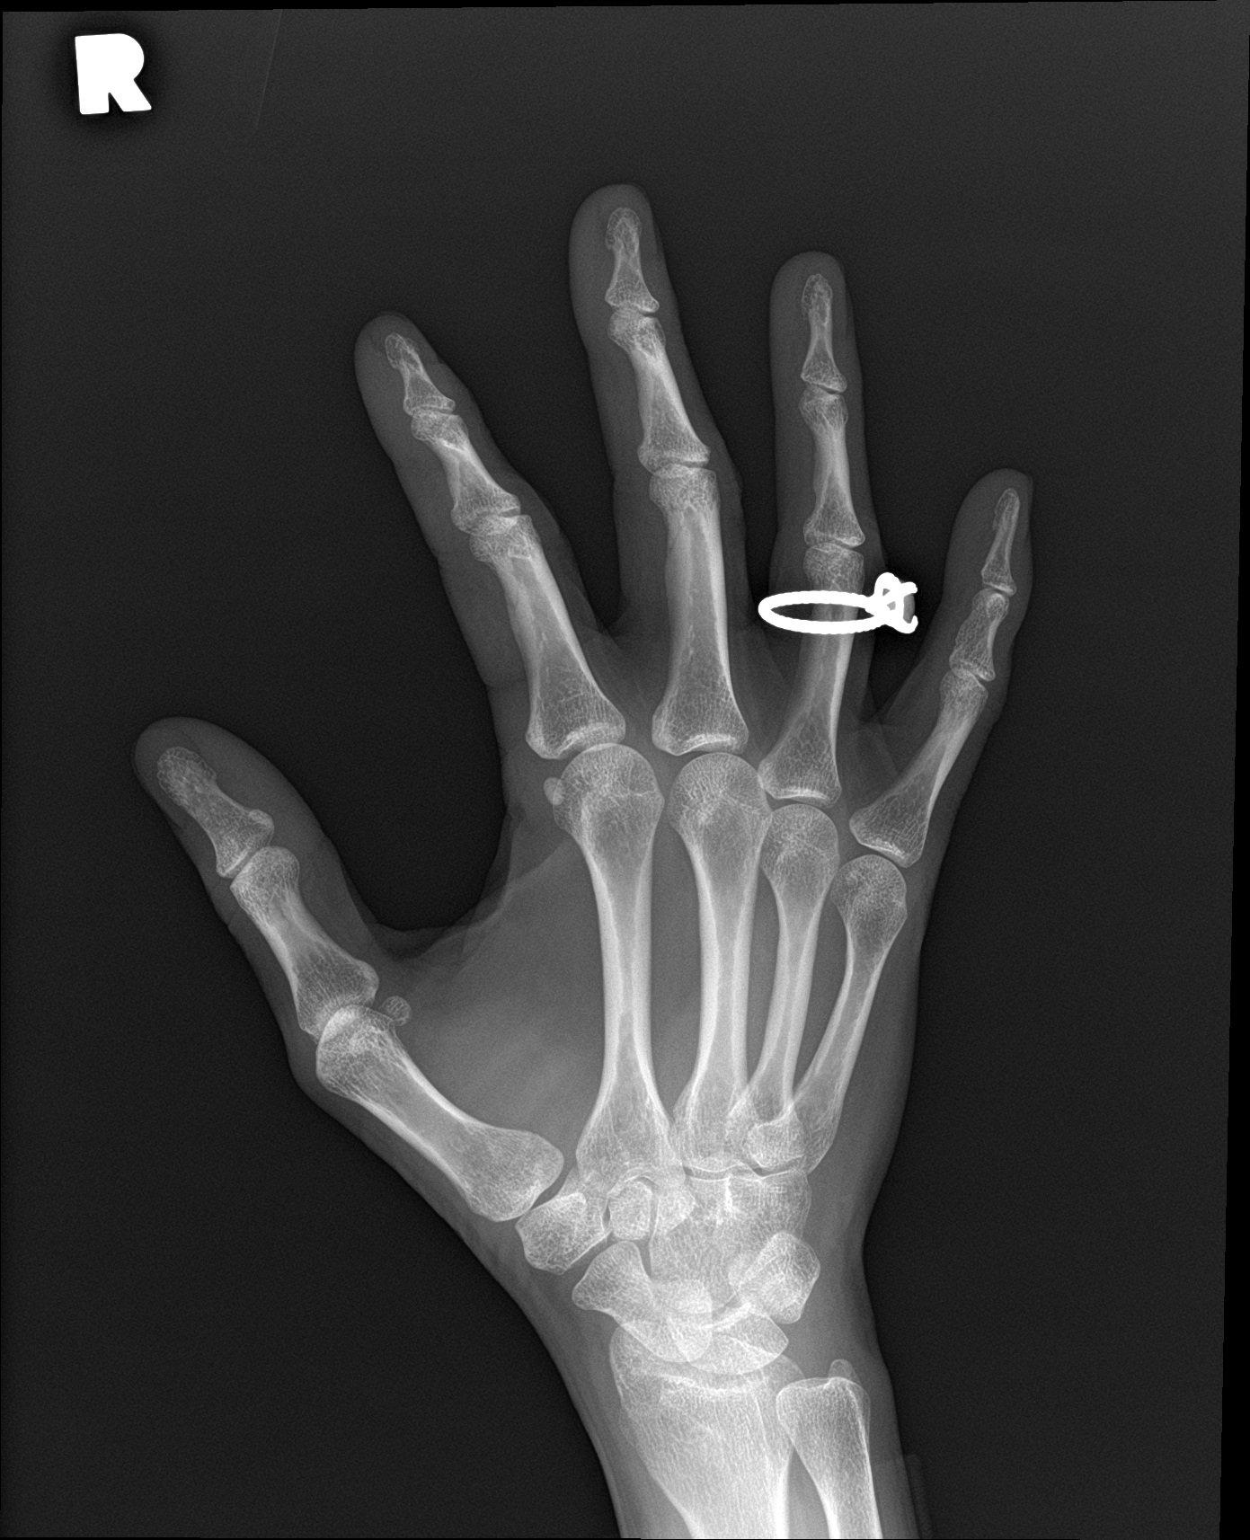

[hand lat]
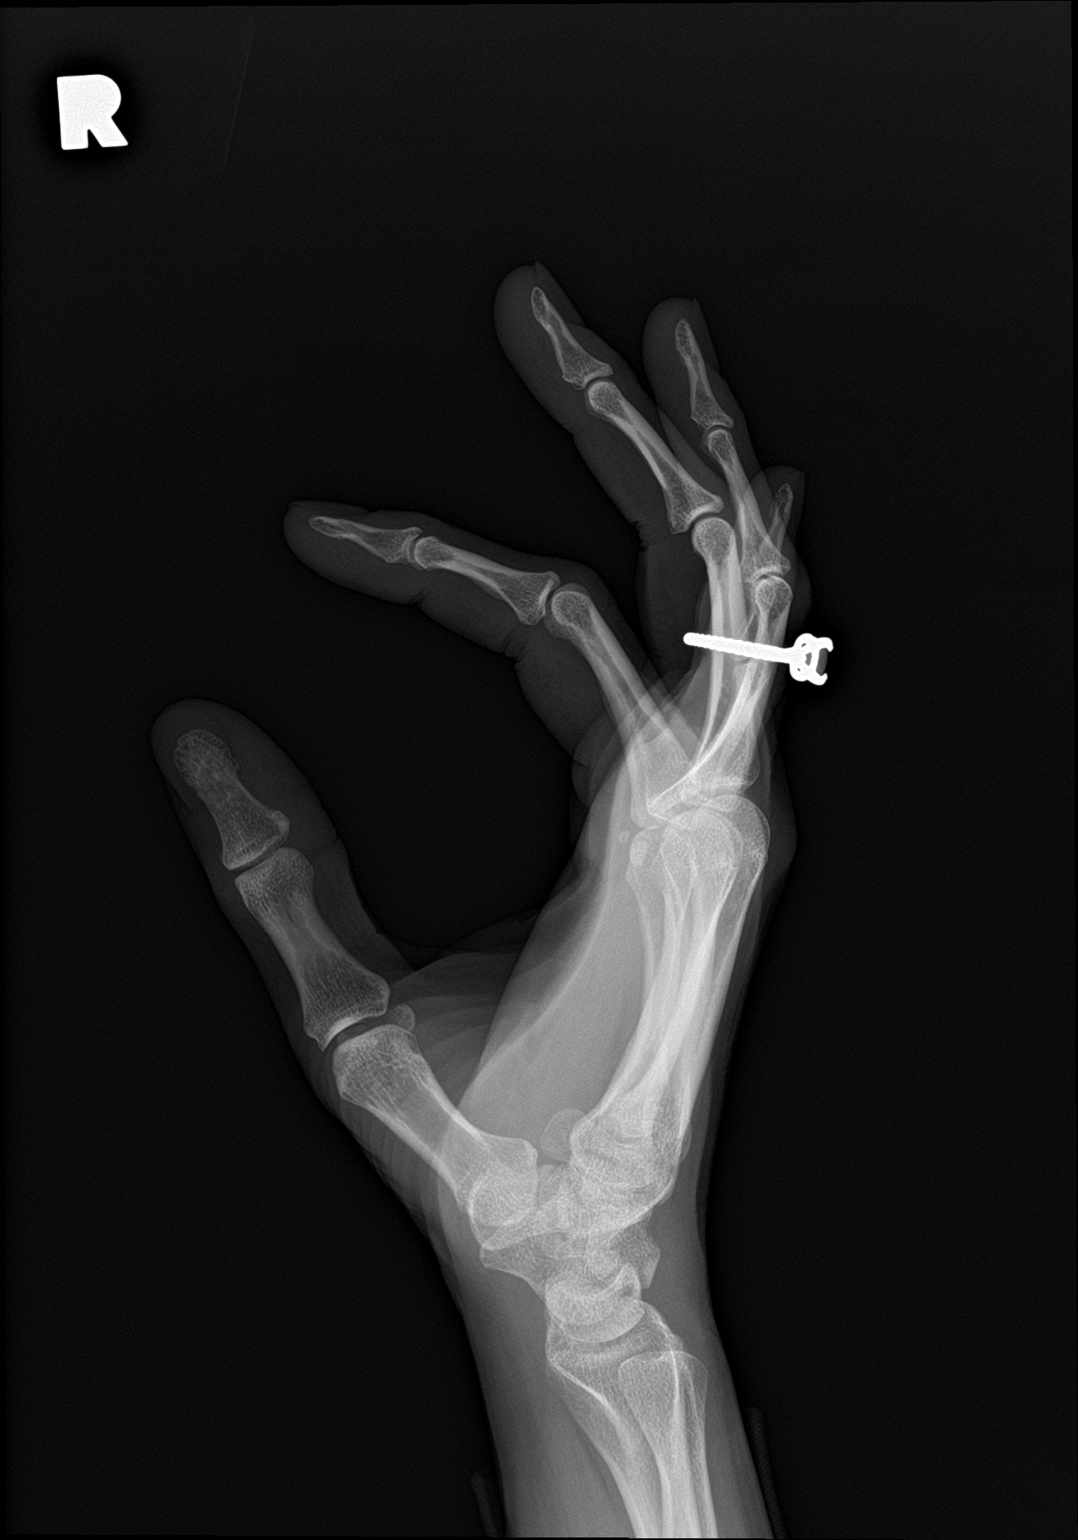

[3 of 3 positions shown; findings below may reference images not displayed]

FINDINGS: There is limited evaluation of the proximal phalanx of the fourth
right finger secondary to overlying jewelry. There is no evidence of
acute fracture or dislocation. A chronic versus congenital deformity
is seen involving the middle phalanx of the fifth right finger.
There is no evidence of arthropathy or other focal bone abnormality.
Soft tissues are unremarkable.
IMPRESSION: 1. No acute findings.
2. Chronic versus congenital deformity of the middle phalanx of the
fifth right finger.
